# Patient Record
Sex: Female | Born: 1970
Health system: Southern US, Community
[De-identification: ages and names within clinical notes are randomized; demographics above are authoritative.]

## PROBLEM LIST (undated history)

## (undated) DIAGNOSIS — J45909 Unspecified asthma, uncomplicated: Secondary | ICD-10-CM

## (undated) DIAGNOSIS — Z9889 Other specified postprocedural states: Secondary | ICD-10-CM

## (undated) DIAGNOSIS — D649 Anemia, unspecified: Secondary | ICD-10-CM

## (undated) DIAGNOSIS — R112 Nausea with vomiting, unspecified: Secondary | ICD-10-CM

---

## 2007-05-30 ENCOUNTER — Other Ambulatory Visit: Admission: RE | Admit: 2007-05-30 | Discharge: 2007-05-30 | Payer: Self-pay | Admitting: Obstetrics and Gynecology

## 2008-03-18 ENCOUNTER — Emergency Department (HOSPITAL_COMMUNITY): Admission: EM | Admit: 2008-03-18 | Discharge: 2008-03-18 | Payer: Self-pay | Admitting: Emergency Medicine

## 2008-03-27 ENCOUNTER — Emergency Department (HOSPITAL_COMMUNITY): Admission: EM | Admit: 2008-03-27 | Discharge: 2008-03-27 | Payer: Self-pay | Admitting: Family Medicine

## 2008-05-17 ENCOUNTER — Encounter: Admission: RE | Admit: 2008-05-17 | Discharge: 2008-05-17 | Payer: Self-pay | Admitting: Obstetrics and Gynecology

## 2008-11-04 ENCOUNTER — Emergency Department (HOSPITAL_COMMUNITY): Admission: EM | Admit: 2008-11-04 | Discharge: 2008-11-04 | Payer: Self-pay | Admitting: Emergency Medicine

## 2009-07-01 ENCOUNTER — Other Ambulatory Visit: Admission: RE | Admit: 2009-07-01 | Discharge: 2009-07-01 | Payer: Self-pay | Admitting: *Deleted

## 2010-05-08 LAB — POCT PREGNANCY, URINE: Preg Test, Ur: NEGATIVE

## 2010-12-30 ENCOUNTER — Other Ambulatory Visit: Payer: Self-pay | Admitting: Obstetrics and Gynecology

## 2010-12-30 DIAGNOSIS — Z1231 Encounter for screening mammogram for malignant neoplasm of breast: Secondary | ICD-10-CM

## 2011-01-14 ENCOUNTER — Other Ambulatory Visit (HOSPITAL_COMMUNITY)
Admission: RE | Admit: 2011-01-14 | Discharge: 2011-01-14 | Disposition: A | Discharge status: Home / Self Care | Payer: Federal, State, Local not specified - PPO | Source: Ambulatory Visit | Attending: Family Medicine | Admitting: Family Medicine

## 2011-01-14 DIAGNOSIS — Z01419 Encounter for gynecological examination (general) (routine) without abnormal findings: Secondary | ICD-10-CM | POA: Insufficient documentation

## 2011-01-26 ENCOUNTER — Ambulatory Visit
Admission: RE | Admit: 2011-01-26 | Discharge: 2011-01-26 | Disposition: A | Discharge status: Home / Self Care | Payer: Federal, State, Local not specified - PPO | Source: Ambulatory Visit | Attending: Obstetrics and Gynecology | Admitting: Obstetrics and Gynecology

## 2011-01-26 DIAGNOSIS — Z1231 Encounter for screening mammogram for malignant neoplasm of breast: Secondary | ICD-10-CM

## 2012-10-12 ENCOUNTER — Other Ambulatory Visit: Payer: Self-pay

## 2012-10-12 DIAGNOSIS — Z1231 Encounter for screening mammogram for malignant neoplasm of breast: Secondary | ICD-10-CM

## 2012-10-24 ENCOUNTER — Other Ambulatory Visit (HOSPITAL_COMMUNITY)
Admission: RE | Admit: 2012-10-24 | Discharge: 2012-10-24 | Disposition: A | Discharge status: Home / Self Care | Payer: Federal, State, Local not specified - PPO | Source: Ambulatory Visit | Attending: Family Medicine | Admitting: Family Medicine

## 2012-10-24 ENCOUNTER — Other Ambulatory Visit: Payer: Self-pay

## 2012-10-24 DIAGNOSIS — Z0000 Encounter for general adult medical examination without abnormal findings: Secondary | ICD-10-CM | POA: Insufficient documentation

## 2012-11-03 ENCOUNTER — Ambulatory Visit
Admission: RE | Admit: 2012-11-03 | Discharge: 2012-11-03 | Disposition: A | Discharge status: Home / Self Care | Payer: Federal, State, Local not specified - PPO | Source: Ambulatory Visit | Attending: Family | Admitting: Family

## 2012-11-03 ENCOUNTER — Ambulatory Visit
Admission: RE | Admit: 2012-11-03 | Discharge: 2012-11-03 | Disposition: A | Discharge status: Home / Self Care | Payer: Federal, State, Local not specified - PPO | Source: Ambulatory Visit

## 2012-11-03 ENCOUNTER — Other Ambulatory Visit: Payer: Self-pay | Admitting: Family

## 2012-11-03 DIAGNOSIS — M545 Low back pain, unspecified: Secondary | ICD-10-CM

## 2012-11-03 DIAGNOSIS — Z1231 Encounter for screening mammogram for malignant neoplasm of breast: Secondary | ICD-10-CM

## 2012-11-16 ENCOUNTER — Ambulatory Visit: Payer: Federal, State, Local not specified - PPO | Attending: Family | Admitting: Physical Therapy

## 2012-11-16 DIAGNOSIS — M545 Low back pain, unspecified: Secondary | ICD-10-CM | POA: Insufficient documentation

## 2012-11-16 DIAGNOSIS — IMO0001 Reserved for inherently not codable concepts without codable children: Secondary | ICD-10-CM | POA: Insufficient documentation

## 2012-11-16 DIAGNOSIS — M25539 Pain in unspecified wrist: Secondary | ICD-10-CM | POA: Insufficient documentation

## 2012-11-21 ENCOUNTER — Ambulatory Visit: Payer: Federal, State, Local not specified - PPO | Admitting: Rehabilitation

## 2012-11-23 ENCOUNTER — Ambulatory Visit: Payer: Federal, State, Local not specified - PPO | Admitting: Rehabilitation

## 2012-11-28 ENCOUNTER — Ambulatory Visit: Payer: Federal, State, Local not specified - PPO | Attending: Family | Admitting: Physical Therapy

## 2012-11-28 DIAGNOSIS — M545 Low back pain, unspecified: Secondary | ICD-10-CM | POA: Insufficient documentation

## 2012-11-28 DIAGNOSIS — M25539 Pain in unspecified wrist: Secondary | ICD-10-CM | POA: Insufficient documentation

## 2012-11-28 DIAGNOSIS — IMO0001 Reserved for inherently not codable concepts without codable children: Secondary | ICD-10-CM | POA: Insufficient documentation

## 2012-11-30 ENCOUNTER — Ambulatory Visit: Payer: Federal, State, Local not specified - PPO | Admitting: Physical Therapy

## 2012-12-05 ENCOUNTER — Ambulatory Visit: Payer: Federal, State, Local not specified - PPO | Admitting: Physical Therapy

## 2012-12-07 ENCOUNTER — Ambulatory Visit: Payer: Federal, State, Local not specified - PPO | Admitting: Rehabilitation

## 2012-12-12 ENCOUNTER — Ambulatory Visit: Payer: Federal, State, Local not specified - PPO | Admitting: Rehabilitation

## 2012-12-14 ENCOUNTER — Ambulatory Visit: Payer: Federal, State, Local not specified - PPO | Admitting: Physical Therapy

## 2012-12-19 ENCOUNTER — Ambulatory Visit: Payer: Federal, State, Local not specified - PPO | Admitting: Rehabilitation

## 2012-12-21 ENCOUNTER — Ambulatory Visit: Payer: Federal, State, Local not specified - PPO | Admitting: Rehabilitation

## 2012-12-26 ENCOUNTER — Ambulatory Visit: Payer: Federal, State, Local not specified - PPO | Attending: Family | Admitting: Rehabilitation

## 2012-12-26 DIAGNOSIS — IMO0001 Reserved for inherently not codable concepts without codable children: Secondary | ICD-10-CM | POA: Insufficient documentation

## 2012-12-26 DIAGNOSIS — M25539 Pain in unspecified wrist: Secondary | ICD-10-CM | POA: Insufficient documentation

## 2012-12-26 DIAGNOSIS — M545 Low back pain, unspecified: Secondary | ICD-10-CM | POA: Insufficient documentation

## 2012-12-28 ENCOUNTER — Encounter: Payer: Federal, State, Local not specified - PPO | Admitting: Rehabilitation

## 2013-01-03 ENCOUNTER — Ambulatory Visit: Payer: Federal, State, Local not specified - PPO | Admitting: Rehabilitation

## 2013-01-05 ENCOUNTER — Encounter: Payer: Federal, State, Local not specified - PPO | Admitting: Physical Therapy

## 2014-08-24 ENCOUNTER — Other Ambulatory Visit: Payer: Self-pay

## 2014-08-24 DIAGNOSIS — Z1231 Encounter for screening mammogram for malignant neoplasm of breast: Secondary | ICD-10-CM

## 2014-08-28 ENCOUNTER — Ambulatory Visit
Admission: RE | Admit: 2014-08-28 | Discharge: 2014-08-28 | Disposition: A | Discharge status: Home / Self Care | Payer: Federal, State, Local not specified - PPO | Source: Ambulatory Visit

## 2014-08-28 DIAGNOSIS — Z1231 Encounter for screening mammogram for malignant neoplasm of breast: Secondary | ICD-10-CM

## 2014-09-20 ENCOUNTER — Emergency Department (HOSPITAL_COMMUNITY): Payer: Federal, State, Local not specified - PPO

## 2014-09-20 ENCOUNTER — Encounter (HOSPITAL_COMMUNITY): Payer: Self-pay | Admitting: *Deleted

## 2014-09-20 ENCOUNTER — Emergency Department (HOSPITAL_COMMUNITY)
Admission: EM | Admit: 2014-09-20 | Discharge: 2014-09-20 | Disposition: A | Discharge status: Home / Self Care | Payer: Federal, State, Local not specified - PPO | Attending: Emergency Medicine | Admitting: Emergency Medicine

## 2014-09-20 DIAGNOSIS — Z79899 Other long term (current) drug therapy: Secondary | ICD-10-CM | POA: Insufficient documentation

## 2014-09-20 DIAGNOSIS — R1013 Epigastric pain: Secondary | ICD-10-CM | POA: Insufficient documentation

## 2014-09-20 DIAGNOSIS — Z3202 Encounter for pregnancy test, result negative: Secondary | ICD-10-CM | POA: Diagnosis not present

## 2014-09-20 DIAGNOSIS — R109 Unspecified abdominal pain: Secondary | ICD-10-CM

## 2014-09-20 DIAGNOSIS — J45909 Unspecified asthma, uncomplicated: Secondary | ICD-10-CM | POA: Insufficient documentation

## 2014-09-20 DIAGNOSIS — Z7951 Long term (current) use of inhaled steroids: Secondary | ICD-10-CM | POA: Insufficient documentation

## 2014-09-20 DIAGNOSIS — R1011 Right upper quadrant pain: Secondary | ICD-10-CM | POA: Diagnosis not present

## 2014-09-20 HISTORY — DX: Unspecified asthma, uncomplicated: J45.909

## 2014-09-20 LAB — URINALYSIS, ROUTINE W REFLEX MICROSCOPIC
Bilirubin Urine: NEGATIVE
Glucose, UA: NEGATIVE mg/dL
Hgb urine dipstick: NEGATIVE
Ketones, ur: NEGATIVE mg/dL
Leukocytes, UA: NEGATIVE
Nitrite: NEGATIVE
Protein, ur: NEGATIVE mg/dL
Specific Gravity, Urine: 1.009 (ref 1.005–1.030)
Urobilinogen, UA: 0.2 mg/dL (ref 0.0–1.0)
pH: 5.5 (ref 5.0–8.0)

## 2014-09-20 LAB — CBC
HCT: 35 % — ABNORMAL LOW (ref 36.0–46.0)
Hemoglobin: 11.8 g/dL — ABNORMAL LOW (ref 12.0–15.0)
MCH: 29.4 pg (ref 26.0–34.0)
MCHC: 33.7 g/dL (ref 30.0–36.0)
MCV: 87.3 fL (ref 78.0–100.0)
Platelets: 295 10*3/uL (ref 150–400)
RBC: 4.01 MIL/uL (ref 3.87–5.11)
RDW: 13.3 % (ref 11.5–15.5)
WBC: 9.5 10*3/uL (ref 4.0–10.5)

## 2014-09-20 LAB — COMPREHENSIVE METABOLIC PANEL
ALT: 27 U/L (ref 14–54)
AST: 25 U/L (ref 15–41)
Albumin: 3.9 g/dL (ref 3.5–5.0)
Alkaline Phosphatase: 37 U/L — ABNORMAL LOW (ref 38–126)
Anion gap: 8 (ref 5–15)
BUN: 12 mg/dL (ref 6–20)
CO2: 24 mmol/L (ref 22–32)
Calcium: 9.1 mg/dL (ref 8.9–10.3)
Chloride: 105 mmol/L (ref 101–111)
Creatinine, Ser: 0.86 mg/dL (ref 0.44–1.00)
GFR calc Af Amer: >60 mL/min (ref >60)
GFR calc non Af Amer: >60 mL/min (ref >60)
Glucose, Bld: 134 mg/dL — ABNORMAL HIGH (ref 65–99)
Potassium: 3.8 mmol/L (ref 3.5–5.1)
Sodium: 137 mmol/L (ref 135–145)
Total Bilirubin: 0.5 mg/dL (ref 0.3–1.2)
Total Protein: 7 g/dL (ref 6.5–8.1)

## 2014-09-20 LAB — LIPASE, BLOOD: Lipase: 29 U/L (ref 22–51)

## 2014-09-20 LAB — POC URINE PREG, ED: Preg Test, Ur: NEGATIVE

## 2014-09-20 MED ORDER — HYDROMORPHONE HCL 1 MG/ML IJ SOLN
1.0000 mg | Freq: Once | INTRAMUSCULAR | Status: AC
Start: 2014-09-20 — End: 2014-09-20
  Administered 2014-09-20: 1 mg via INTRAVENOUS
  Filled 2014-09-20: qty 1

## 2014-09-20 MED ORDER — ONDANSETRON HCL 4 MG PO TABS: 4.0000 mg | ORAL_TABLET | Freq: Four times a day (QID) | ORAL | Status: AC

## 2014-09-20 MED ORDER — OXYCODONE-ACETAMINOPHEN 5-325 MG PO TABS: 1.0000 | ORAL_TABLET | ORAL | Status: AC | PRN

## 2014-09-20 MED ORDER — ONDANSETRON HCL 4 MG/2ML IJ SOLN
4.0000 mg | Freq: Once | INTRAMUSCULAR | Status: AC
  Administered 2014-09-20: 4 mg via INTRAVENOUS
  Filled 2014-09-20: qty 2

## 2014-09-20 MED ORDER — SODIUM CHLORIDE 0.9 % IV BOLUS (SEPSIS)
1000.0000 mL | Freq: Once | INTRAVENOUS | Status: AC
  Administered 2014-09-20: 1000 mL via INTRAVENOUS

## 2014-09-20 NOTE — ED Notes (Signed)
Patient presents stating she has pain to the RUQ that radiates to her back.  Pain was there this AM and took Aleve and it calmed down but after eating yellow rice with sausage the pain got worse.  Last took Motrin 

## 2014-09-27 NOTE — ED Provider Notes (Signed)
CSN: [id number]     Arrival date & time 09/20/14  2033 History   First MD Initiated Contact with Patient 09/20/14 2109     Chief Complaint  Patient presents with  . Abdominal Pain     (Consider location/radiation/quality/duration/timing/severity/associated sxs/prior Treatment) HPI   44 year old female with abdominal pain. Pain is in epigastrium and right upper quadrant. Radiates straight through to her back. Onset this morning. Slowly improved but not completely resolved after taking some Aleve. Acutely worsening pain within a few minutes after eating lunch. Associated nausea. No vomiting. No fevers or chills. No urinary complaints. Patient has had similar type pain intermittently previously but not nearly to this degree. Surgical history significant for cesarean section.  Past Medical History  Diagnosis Date  . Asthma    Past Surgical History  Procedure Laterality Date  . Cesarean section     No family history on file. Social History  Substance Use Topics  . Smoking status: Never Smoker   . Smokeless tobacco: Never Used  . Alcohol Use: No   OB History    No data available     Review of Systems  All systems reviewed and negative, other than as noted in HPI.   Allergies  Review of patient's allergies indicates no known allergies.  Home Medications   Prior to Admission medications   Medication Sig Start Date End Date Taking? Authorizing Provider  albuterol (PROVENTIL HFA;VENTOLIN HFA) 108 (90 BASE) MCG/ACT inhaler Inhale 1-2 puffs into the lungs every 6 (six) hours as needed for wheezing or shortness of breath.   Yes Historical Provider, MD  budesonide (PULMICORT) 180 MCG/ACT inhaler Inhale 1 puff into the lungs 2 (two) times daily.   Yes Historical Provider, MD  cetirizine (ZYRTEC) 10 MG tablet Take 10 mg by mouth daily.   Yes Historical Provider, MD  ibuprofen (ADVIL,MOTRIN) 200 MG tablet Take 400 mg by mouth every 6 (six) hours as needed for mild pain or moderate  pain.   Yes Historical Provider, MD  ibuprofen (ADVIL,MOTRIN) 800 MG tablet Take 800 mg by mouth every 8 (eight) hours as needed for mild pain, moderate pain or cramping.   Yes Historical Provider, MD  Multiple Vitamins-Iron (MULTIVITAMIN/IRON PO) Take 1 tablet by mouth daily at 12 noon.   Yes Historical Provider, MD  ondansetron (ZOFRAN) 4 MG tablet Take 1 tablet (4 mg total) by mouth every 6 (six) hours. 09/20/14   Raeford Razor, MD  oxyCODONE-acetaminophen (PERCOCET/ROXICET) 5-325 MG per tablet Take 1-2 tablets by mouth every 4 (four) hours as needed for severe pain. 09/20/14   Raeford Razor, MD   BP 122/87 mmHg  Pulse 74  Temp(Src) 98.7 F (37.1 C) (Oral)  Resp 20  Ht 5\' 4"  (1.626 m)  Wt 215 lb 11.2 oz (97.841 kg)  BMI 37.01 kg/m2  SpO2 100%  LMP 08/31/2014 Physical Exam  Constitutional: She appears well-developed and well-nourished. No distress.  HENT:  Head: Normocephalic and atraumatic.  Eyes: Conjunctivae are normal. Right eye exhibits no discharge. Left eye exhibits no discharge.  Neck: Neck supple.  Cardiovascular: Normal rate, regular rhythm and normal heart sounds.  Exam reveals no gallop and no friction rub.   No murmur heard. Pulmonary/Chest: Effort normal and breath sounds normal. No respiratory distress.  Abdominal: Soft. She exhibits no distension. There is tenderness.  Tenderness in the right upper quadrant with guarding on deep palpation. No rebound. Does not seem distended. Abdominal exam is somewhat limited by body habitus though.  Musculoskeletal: She exhibits no  edema or tenderness.  Neurological: She is alert.  Skin: Skin is warm and dry.  Psychiatric: She has a normal mood and affect. Her behavior is normal. Thought content normal.  Nursing note and vitals reviewed.   ED Course  Procedures (including critical care time) Labs Review Labs Reviewed  COMPREHENSIVE METABOLIC PANEL - Abnormal; Notable for the following:    Glucose, Bld 134 (*)    Alkaline  Phosphatase 37 (*)    All other components within normal limits  CBC - Abnormal; Notable for the following:    Hemoglobin 11.8 (*)    HCT 35.0 (*)    All other components within normal limits  LIPASE, BLOOD  URINALYSIS, ROUTINE W REFLEX MICROSCOPIC (NOT AT [facility])  POC URINE PREG, ED    Imaging Review No results found. I have personally reviewed and evaluated these images and lab results as part of my medical decision-making.   EKG Interpretation None      MDM   Final diagnoses:  Abdominal pain, unspecified abdominal location    44 year old female with symptoms consistent with biliary colic. Gallstones noted on ultrasound. No ultrasound evidence of cholecystitis. Symptoms now improved. Plan continued symptomatic treatment. General surgical follow-up.    Raeford Razor, MD 09/27/14 1044

## 2014-10-05 ENCOUNTER — Other Ambulatory Visit: Payer: Self-pay | Admitting: General Surgery

## 2014-10-09 NOTE — Pre-Procedure Instructions (Signed)
Dawn Arroyo  10/09/2014      [id number] - [place], [place] - [address] AT [facility] [address] [place] [address] Phone: [phone] Fax: [phone]    Your procedure is scheduled on Thurs, Sept 15 @ 7:30 AM  Report to [facility] Admitting at 5:30 AM  Call this number if you have problems the morning of surgery:  [phone]   Remember:  Do not eat food or drink liquids after midnight.  Take these medicines the morning of surgery with A SIP OF WATER Albuterol<Bring Your Inhaler With You>,Pulmicort,Zyrtec(Cetirizine),Ondansetron(Zofran-if needed),and Pain Pill(if needed)              Stop taking your Ibuprofen. No Goody's,BC's,Aleve,Aspirin,Fish Oil,or any Herbal Medications.    Do not wear jewelry, make-up or nail polish.  Do not wear lotions, powders, or perfumes.  You may wear deodorant.  Do not shave 48 hours prior to surgery.    Do not bring valuables to the hospital.  [facility] is not responsible for any belongings or valuables.  Contacts, dentures or bridgework may not be worn into surgery.  Leave your suitcase in the car.  After surgery it may be brought to your room.  For patients admitted to the hospital, discharge time will be determined by your treatment team.  Patients discharged the day of surgery will not be allowed to drive home.    Special instructions:  [facility] - Preparing for Surgery  Before surgery, you can play an important role.  Because skin is not sterile, your skin needs to be as free of germs as possible.  You can reduce the number of germs on you skin by washing with CHG (chlorahexidine gluconate) soap before surgery.  CHG is an antiseptic cleaner which kills germs and bonds with the skin to continue killing germs even after washing.  Please DO NOT use if you have an allergy to CHG or antibacterial soaps.  If your skin becomes reddened/irritated stop using the CHG and  inform your nurse when you arrive at Short Stay.  Do not shave (including legs and underarms) for at least 48 hours prior to the first CHG shower.  You may shave your face.  Please follow these instructions carefully:   1.  Shower with CHG Soap the night before surgery and the                                morning of Surgery.  2.  If you choose to wash your hair, wash your hair first as usual with your       normal shampoo.  3.  After you shampoo, rinse your hair and body thoroughly to remove the                      Shampoo.  4.  Use CHG as you would any other liquid soap.  You can apply chg directly       to the skin and wash gently with scrungie or a clean washcloth.  5.  Apply the CHG Soap to your body ONLY FROM THE NECK DOWN.        Do not use on open wounds or open sores.  Avoid contact with your eyes,       ears, mouth and genitals (private parts).  Wash genitals (private parts)  with your normal soap.  6.  Wash thoroughly, paying special attention to the area where your surgery        will be performed.  7.  Thoroughly rinse your body with warm water from the neck down.  8.  DO NOT shower/wash with your normal soap after using and rinsing off       the CHG Soap.  9.  Pat yourself dry with a clean towel.            10.  Wear clean pajamas.            11.  Place clean sheets on your bed the night of your first shower and do not        sleep with pets.  Day of Surgery  Do not apply any lotions/deoderants the morning of surgery.  Please wear clean clothes to the hospital/surgery center.    Please read over the following fact sheets that you were given. Pain Booklet, Coughing and Deep Breathing and Surgical Site Infection Prevention

## 2014-10-10 ENCOUNTER — Encounter (HOSPITAL_COMMUNITY): Payer: Self-pay | Admitting: *Deleted

## 2014-10-10 ENCOUNTER — Inpatient Hospital Stay (HOSPITAL_COMMUNITY)
Admission: RE | Admit: 2014-10-10 | Discharge: 2014-10-10 | Disposition: A | Discharge status: Home / Self Care | Payer: Federal, State, Local not specified - PPO | Source: Ambulatory Visit

## 2014-10-10 NOTE — Progress Notes (Signed)
Pt denies any cardiac history, chest pain or sob. 

## 2014-10-11 ENCOUNTER — Encounter (HOSPITAL_COMMUNITY)
Admission: RE | Disposition: A | Discharge status: Home / Self Care | Payer: Self-pay | Source: Ambulatory Visit | Attending: General Surgery

## 2014-10-11 ENCOUNTER — Ambulatory Visit (HOSPITAL_COMMUNITY)
Admission: RE | Admit: 2014-10-11 | Discharge: 2014-10-11 | Disposition: A | Discharge status: Home / Self Care | Payer: Federal, State, Local not specified - PPO | Source: Ambulatory Visit | Attending: General Surgery | Admitting: General Surgery

## 2014-10-11 ENCOUNTER — Ambulatory Visit (HOSPITAL_COMMUNITY): Payer: Federal, State, Local not specified - PPO | Admitting: Anesthesiology

## 2014-10-11 ENCOUNTER — Encounter (HOSPITAL_COMMUNITY): Payer: Self-pay | Admitting: *Deleted

## 2014-10-11 DIAGNOSIS — K8010 Calculus of gallbladder with chronic cholecystitis without obstruction: Secondary | ICD-10-CM | POA: Diagnosis present

## 2014-10-11 HISTORY — PX: CHOLECYSTECTOMY: SHX55

## 2014-10-11 HISTORY — DX: Other specified postprocedural states: Z98.890

## 2014-10-11 HISTORY — DX: Anemia, unspecified: D64.9

## 2014-10-11 HISTORY — DX: Other specified postprocedural states: R11.2

## 2014-10-11 LAB — CBC WITH DIFFERENTIAL/PLATELET
Basophils Absolute: 0 10*3/uL (ref 0.0–0.1)
Basophils Relative: 1 %
Eosinophils Absolute: 0.3 10*3/uL (ref 0.0–0.7)
Eosinophils Relative: 5 %
HCT: 33.1 % — ABNORMAL LOW (ref 36.0–46.0)
Hemoglobin: 10.8 g/dL — ABNORMAL LOW (ref 12.0–15.0)
Lymphocytes Relative: 23 %
Lymphs Abs: 1.4 10*3/uL (ref 0.7–4.0)
MCH: 29 pg (ref 26.0–34.0)
MCHC: 32.6 g/dL (ref 30.0–36.0)
MCV: 89 fL (ref 78.0–100.0)
Monocytes Absolute: 0.5 10*3/uL (ref 0.1–1.0)
Monocytes Relative: 8 %
Neutro Abs: 3.8 10*3/uL (ref 1.7–7.7)
Neutrophils Relative %: 63 %
Platelets: 268 10*3/uL (ref 150–400)
RBC: 3.72 MIL/uL — ABNORMAL LOW (ref 3.87–5.11)
RDW: 13.2 % (ref 11.5–15.5)
WBC: 6 10*3/uL (ref 4.0–10.5)

## 2014-10-11 LAB — COMPREHENSIVE METABOLIC PANEL
ALT: 17 U/L (ref 14–54)
AST: 16 U/L (ref 15–41)
Albumin: 3.7 g/dL (ref 3.5–5.0)
Alkaline Phosphatase: 33 U/L — ABNORMAL LOW (ref 38–126)
Anion gap: 8 (ref 5–15)
BUN: 11 mg/dL (ref 6–20)
CO2: 24 mmol/L (ref 22–32)
Calcium: 8.6 mg/dL — ABNORMAL LOW (ref 8.9–10.3)
Chloride: 104 mmol/L (ref 101–111)
Creatinine, Ser: 0.87 mg/dL (ref 0.44–1.00)
GFR calc Af Amer: >60 mL/min (ref >60)
GFR calc non Af Amer: >60 mL/min (ref >60)
Glucose, Bld: 94 mg/dL (ref 65–99)
Potassium: 3.7 mmol/L (ref 3.5–5.1)
Sodium: 136 mmol/L (ref 135–145)
Total Bilirubin: 0.8 mg/dL (ref 0.3–1.2)
Total Protein: 6.4 g/dL — ABNORMAL LOW (ref 6.5–8.1)

## 2014-10-11 LAB — HCG, SERUM, QUALITATIVE: Preg, Serum: NEGATIVE

## 2014-10-11 SURGERY — LAPAROSCOPIC CHOLECYSTECTOMY
Anesthesia: General

## 2014-10-11 MED ORDER — PROPOFOL 10 MG/ML IV BOLUS
INTRAVENOUS | Status: AC
  Filled 2014-10-11: qty 20

## 2014-10-11 MED ORDER — PROMETHAZINE HCL 25 MG/ML IJ SOLN
6.2500 mg | INTRAMUSCULAR | Status: DC | PRN
  Administered 2014-10-11: 6.25 mg via INTRAVENOUS

## 2014-10-11 MED ORDER — ROCURONIUM BROMIDE 100 MG/10ML IV SOLN
INTRAVENOUS | Status: DC | PRN
  Administered 2014-10-11 (×2): 10 mg via INTRAVENOUS
  Administered 2014-10-11: 30 mg via INTRAVENOUS

## 2014-10-11 MED ORDER — GLYCOPYRROLATE 0.2 MG/ML IJ SOLN
INTRAMUSCULAR | Status: DC | PRN
  Administered 2014-10-11: 0.6 mg via INTRAVENOUS

## 2014-10-11 MED ORDER — DEXAMETHASONE SODIUM PHOSPHATE 10 MG/ML IJ SOLN
INTRAMUSCULAR | Status: AC
  Filled 2014-10-11: qty 1

## 2014-10-11 MED ORDER — FENTANYL CITRATE (PF) 100 MCG/2ML IJ SOLN
INTRAMUSCULAR | Status: DC | PRN
  Administered 2014-10-11 (×2): 100 ug via INTRAVENOUS
  Administered 2014-10-11 (×6): 50 ug via INTRAVENOUS

## 2014-10-11 MED ORDER — GLYCOPYRROLATE 0.2 MG/ML IJ SOLN
INTRAMUSCULAR | Status: AC
  Filled 2014-10-11: qty 3

## 2014-10-11 MED ORDER — 0.9 % SODIUM CHLORIDE (POUR BTL) OPTIME
TOPICAL | Status: DC | PRN
  Administered 2014-10-11: 1000 mL

## 2014-10-11 MED ORDER — ROCURONIUM BROMIDE 50 MG/5ML IV SOLN
INTRAVENOUS | Status: AC
  Filled 2014-10-11: qty 1

## 2014-10-11 MED ORDER — PROPOFOL 10 MG/ML IV BOLUS
INTRAVENOUS | Status: DC | PRN
  Administered 2014-10-11: 200 mg via INTRAVENOUS

## 2014-10-11 MED ORDER — ONDANSETRON HCL 4 MG/2ML IJ SOLN
INTRAMUSCULAR | Status: AC
  Filled 2014-10-11: qty 2

## 2014-10-11 MED ORDER — SCOPOLAMINE 1 MG/3DAYS TD PT72
MEDICATED_PATCH | TRANSDERMAL | Status: DC | PRN
  Administered 2014-10-11: 1 via TRANSDERMAL

## 2014-10-11 MED ORDER — PROMETHAZINE HCL 25 MG/ML IJ SOLN
INTRAMUSCULAR | Status: AC
  Administered 2014-10-11: 6.25 mg via INTRAVENOUS
  Filled 2014-10-11: qty 1

## 2014-10-11 MED ORDER — BUPIVACAINE-EPINEPHRINE 0.25% -1:200000 IJ SOLN
INTRAMUSCULAR | Status: DC | PRN
Start: 2014-10-11 — End: 2014-10-11
  Administered 2014-10-11: 10 mL

## 2014-10-11 MED ORDER — SCOPOLAMINE 1 MG/3DAYS TD PT72
MEDICATED_PATCH | TRANSDERMAL | Status: AC
  Filled 2014-10-11: qty 1

## 2014-10-11 MED ORDER — ARTIFICIAL TEARS OP OINT
TOPICAL_OINTMENT | OPHTHALMIC | Status: DC | PRN
Start: 2014-10-11 — End: 2014-10-11
  Administered 2014-10-11: 1 via OPHTHALMIC

## 2014-10-11 MED ORDER — OXYCODONE-ACETAMINOPHEN 10-325 MG PO TABS: 1.0000 | ORAL_TABLET | Freq: Four times a day (QID) | ORAL | Status: AC | PRN

## 2014-10-11 MED ORDER — LIDOCAINE HCL (CARDIAC) 20 MG/ML IV SOLN
INTRAVENOUS | Status: DC | PRN
  Administered 2014-10-11: 80 mg via INTRAVENOUS

## 2014-10-11 MED ORDER — FENTANYL CITRATE (PF) 250 MCG/5ML IJ SOLN
INTRAMUSCULAR | Status: AC
  Filled 2014-10-11: qty 5

## 2014-10-11 MED ORDER — CEFAZOLIN SODIUM-DEXTROSE 2-3 GM-% IV SOLR: 2.0000 g | INTRAVENOUS | Status: DC

## 2014-10-11 MED ORDER — HYDROMORPHONE HCL 1 MG/ML IJ SOLN
0.5000 mg | INTRAMUSCULAR | Status: DC | PRN
  Administered 2014-10-11: 0.25 mg via INTRAVENOUS

## 2014-10-11 MED ORDER — OXYCODONE HCL 5 MG PO TABS
ORAL_TABLET | ORAL | Status: AC
  Administered 2014-10-11: 10 mg via ORAL
  Filled 2014-10-11: qty 2

## 2014-10-11 MED ORDER — NEOSTIGMINE METHYLSULFATE 10 MG/10ML IV SOLN
INTRAVENOUS | Status: AC
  Filled 2014-10-11: qty 1

## 2014-10-11 MED ORDER — KETOROLAC TROMETHAMINE 15 MG/ML IJ SOLN
INTRAMUSCULAR | Status: AC
  Administered 2014-10-11: 15 mg via INTRAVENOUS
  Filled 2014-10-11: qty 1

## 2014-10-11 MED ORDER — ONDANSETRON HCL 4 MG/2ML IJ SOLN
INTRAMUSCULAR | Status: DC | PRN
  Administered 2014-10-11: 4 mg via INTRAVENOUS

## 2014-10-11 MED ORDER — SODIUM CHLORIDE 0.9 % IR SOLN
Status: DC | PRN
  Administered 2014-10-11: 1000 mL

## 2014-10-11 MED ORDER — NEOSTIGMINE METHYLSULFATE 10 MG/10ML IV SOLN
INTRAVENOUS | Status: DC | PRN
  Administered 2014-10-11: 4 mg via INTRAVENOUS

## 2014-10-11 MED ORDER — GLYCOPYRROLATE 0.2 MG/ML IJ SOLN
INTRAMUSCULAR | Status: AC
  Filled 2014-10-11: qty 4

## 2014-10-11 MED ORDER — OXYCODONE HCL 5 MG PO TABS
5.0000 mg | ORAL_TABLET | ORAL | Status: DC | PRN
  Administered 2014-10-11: 10 mg via ORAL
  Filled 2014-10-11: qty 2

## 2014-10-11 MED ORDER — CEFAZOLIN SODIUM-DEXTROSE 2-3 GM-% IV SOLR
INTRAVENOUS | Status: AC
  Administered 2014-10-11: 2 g via INTRAVENOUS
  Filled 2014-10-11: qty 50

## 2014-10-11 MED ORDER — HYDROMORPHONE HCL 1 MG/ML IJ SOLN
INTRAMUSCULAR | Status: AC
  Administered 2014-10-11: 0.25 mg via INTRAVENOUS
  Filled 2014-10-11: qty 1

## 2014-10-11 MED ORDER — BUPIVACAINE-EPINEPHRINE (PF) 0.25% -1:200000 IJ SOLN
INTRAMUSCULAR | Status: AC
  Filled 2014-10-11: qty 30

## 2014-10-11 MED ORDER — KETOROLAC TROMETHAMINE 15 MG/ML IJ SOLN
15.0000 mg | Freq: Four times a day (QID) | INTRAMUSCULAR | Status: DC | PRN
  Administered 2014-10-11: 15 mg via INTRAVENOUS
  Filled 2014-10-11 (×2): qty 1

## 2014-10-11 MED ORDER — LACTATED RINGERS IV SOLN
INTRAVENOUS | Status: DC | PRN
  Administered 2014-10-11 (×2): via INTRAVENOUS

## 2014-10-11 MED ORDER — DEXAMETHASONE SODIUM PHOSPHATE 10 MG/ML IJ SOLN
INTRAMUSCULAR | Status: DC | PRN
  Administered 2014-10-11: 10 mg via INTRAVENOUS

## 2014-10-11 MED ORDER — LIDOCAINE HCL (CARDIAC) 20 MG/ML IV SOLN
INTRAVENOUS | Status: AC
  Filled 2014-10-11: qty 5

## 2014-10-11 MED ORDER — MIDAZOLAM HCL 2 MG/2ML IJ SOLN
INTRAMUSCULAR | Status: AC
  Filled 2014-10-11: qty 4

## 2014-10-11 MED ORDER — MIDAZOLAM HCL 5 MG/5ML IJ SOLN
INTRAMUSCULAR | Status: DC | PRN
  Administered 2014-10-11: 2 mg via INTRAVENOUS

## 2014-10-11 SURGICAL SUPPLY — 38 items
APPLIER CLIP 5 13 M/L LIGAMAX5 (MISCELLANEOUS) ×2
BLADE SURG ROTATE 9660 (MISCELLANEOUS) ×2 IMPLANT
CANISTER SUCTION 2500CC (MISCELLANEOUS) ×2 IMPLANT
CHLORAPREP W/TINT 26ML (MISCELLANEOUS) ×4 IMPLANT
CLIP APPLIE 5 13 M/L LIGAMAX5 (MISCELLANEOUS) ×1 IMPLANT
CLSR STERI-STRIP ANTIMIC 1/2X4 (GAUZE/BANDAGES/DRESSINGS) ×2 IMPLANT
COVER SURGICAL LIGHT HANDLE (MISCELLANEOUS) ×2 IMPLANT
DEVICE TROCAR PUNCTURE CLOSURE (ENDOMECHANICALS) ×2 IMPLANT
ELECT REM PT RETURN 9FT ADLT (ELECTROSURGICAL) ×2
ELECTRODE REM PT RTRN 9FT ADLT (ELECTROSURGICAL) ×1 IMPLANT
GLOVE BIO SURGEON STRL SZ7 (GLOVE) ×2 IMPLANT
GLOVE BIO SURGEON STRL SZ8 (GLOVE) ×2 IMPLANT
GLOVE BIOGEL PI IND STRL 7.5 (GLOVE) ×1 IMPLANT
GLOVE BIOGEL PI INDICATOR 7.5 (GLOVE) ×1
GOWN STRL REUS W/ TWL LRG LVL3 (GOWN DISPOSABLE) ×2 IMPLANT
GOWN STRL REUS W/ TWL XL LVL3 (GOWN DISPOSABLE) ×1 IMPLANT
GOWN STRL REUS W/TWL LRG LVL3 (GOWN DISPOSABLE) ×2
GOWN STRL REUS W/TWL XL LVL3 (GOWN DISPOSABLE) ×1
KIT BASIN OR (CUSTOM PROCEDURE TRAY) ×2 IMPLANT
KIT ROOM TURNOVER OR (KITS) ×2 IMPLANT
LIQUID BAND (GAUZE/BANDAGES/DRESSINGS) ×2 IMPLANT
NS IRRIG 1000ML POUR BTL (IV SOLUTION) ×2 IMPLANT
PAD ARMBOARD 7.5X6 YLW CONV (MISCELLANEOUS) ×4 IMPLANT
POUCH RETRIEVAL ECOSAC 10 (ENDOMECHANICALS) ×1 IMPLANT
POUCH RETRIEVAL ECOSAC 10MM (ENDOMECHANICALS) ×1
SCISSORS LAP 5X35 DISP (ENDOMECHANICALS) ×2 IMPLANT
SET IRRIG TUBING LAPAROSCOPIC (IRRIGATION / IRRIGATOR) ×2 IMPLANT
SLEEVE ENDOPATH XCEL 5M (ENDOMECHANICALS) ×4 IMPLANT
SPECIMEN JAR SMALL (MISCELLANEOUS) ×2 IMPLANT
STRIP CLOSURE SKIN 1/2X4 (GAUZE/BANDAGES/DRESSINGS) ×2 IMPLANT
SUT MNCRL AB 4-0 PS2 18 (SUTURE) ×2 IMPLANT
SUT VICRYL 0 UR6 27IN ABS (SUTURE) ×4 IMPLANT
TOWEL OR 17X24 6PK STRL BLUE (TOWEL DISPOSABLE) ×2 IMPLANT
TOWEL OR 17X26 10 PK STRL BLUE (TOWEL DISPOSABLE) ×2 IMPLANT
TRAY LAPAROSCOPIC MC (CUSTOM PROCEDURE TRAY) ×2 IMPLANT
TROCAR XCEL BLUNT TIP 100MML (ENDOMECHANICALS) ×2 IMPLANT
TROCAR XCEL NON-BLD 5MMX100MML (ENDOMECHANICALS) ×2 IMPLANT
TUBING INSUFFLATION (TUBING) ×2 IMPLANT

## 2014-10-11 NOTE — H&P (Signed)
44 yof with history of ruq pain after eating worse a couple weeks ago requiring visit to er. associated with some nausea. underwent US that showed multiple stones largest 4 cm. lfts and lipase were normal. since then she continues to have daily pain worse with eating. no fevers.  Other Problems Gilmer Mor, CMA; 10/05/2014 9:52 AM) Asthma Back Pain Cholelithiasis Heart murmur Hemorrhoids  Past Surgical History Gilmer Mor, CMA; 10/05/2014 9:52 AM) Cesarean Section - Multiple  Diagnostic Studies History Gilmer Mor, CMA; 10/05/2014 9:52 AM) Colonoscopy never Mammogram within last year Pap Smear 1-5 years ago  Allergies Gilmer Mor, CMA; 10/05/2014 9:53 AM) No Known Drug Allergies09/09/2014  Medication History (Sonya Bynum, CMA; 10/05/2014 9:53 AM) Advil (  Tablet Chewable, Oral as needed) Active. Albuterol (90MCG/ACT Aerosol Soln, Inhalation as needed) Active. ZyrTEC Allergy (  Capsule, Oral as needed) Active. Medications Reconciled  Social History Gilmer Mor, CMA; 10/05/2014 9:52 AM) Alcohol use Occasional alcohol use. Caffeine use Coffee. No drug use Tobacco use Never smoker.  Family History Gilmer Mor, CMA; 10/05/2014 9:52 AM) Alcohol Abuse Father. Arthritis Mother. Colon Polyps Father.  Pregnancy / Birth History Gilmer Mor, CMA; 10/05/2014 9:52 AM) Age at menarche 12 years. Gravida 3 Maternal age 5-25 Para 2 Regular periods  Review of Systems Lamar Laundry Bynum CMA; 10/05/2014 9:52 AM) General Not Present- Appetite Loss, Chills, Fatigue, Fever, Night Sweats, Weight Gain and Weight Loss. Skin Not Present- Change in Wart/Mole, Dryness, Hives, Jaundice, New Lesions, Non-Healing Wounds, Rash and Ulcer. HEENT Present- Seasonal Allergies. Not Present- Earache, Hearing Loss, Hoarseness, Nose Bleed, Oral Ulcers, Ringing in the Ears, Sinus Pain, Sore Throat, Visual Disturbances, Wears glasses/contact lenses and Yellow Eyes. Breast Not Present- Breast  Mass, Breast Pain, Nipple Discharge and Skin Changes. Cardiovascular Not Present- Chest Pain, Difficulty Breathing Lying Down, Leg Cramps, Palpitations, Rapid Heart Rate, Shortness of Breath and Swelling of Extremities. Gastrointestinal Present- Abdominal Pain, Bloating and Change in Bowel Habits. Not Present- Bloody Stool, Chronic diarrhea, Constipation, Difficulty Swallowing, Excessive gas, Gets full quickly at meals, Hemorrhoids, Indigestion, Nausea, Rectal Pain and Vomiting. Female Genitourinary Not Present- Frequency, Nocturia, Painful Urination, Pelvic Pain and Urgency. Musculoskeletal Not Present- Back Pain, Joint Pain, Joint Stiffness, Muscle Pain, Muscle Weakness and Swelling of Extremities. Neurological Not Present- Decreased Memory, Fainting, Headaches, Numbness, Seizures, Tingling, Tremor, Trouble walking and Weakness. Psychiatric Not Present- Anxiety, Bipolar, Change in Sleep Pattern, Depression, Fearful and Frequent crying. Endocrine Not Present- Cold Intolerance, Excessive Hunger, Hair Changes, Heat Intolerance, Hot flashes and New Diabetes. Hematology Not Present- Easy Bruising, Excessive bleeding, Gland problems, HIV and Persistent Infections.   Vitals (Sonya Bynum CMA; 10/05/2014 9:53 AM) 10/05/2014 9:52 AM Weight: 212 lb Height: 64in Body Surface Area: 2.08 m Body Mass Index: 36.39 kg/m Temp.: 41F(Temporal)  Pulse: 79 (Regular)  BP: 126/80 (Sitting, Left Arm, Standard) Physical Exam Emelia Loron MD; 10/05/2014 10:05 AM) General Mental Status-Alert. Orientation-Oriented X3. Eye Sclera/Conjunctiva - Bilateral-No scleral icterus. Chest and Lung Exam Chest and lung exam reveals -on auscultation, normal breath sounds, no adventitious sounds and normal vocal resonance. Cardiovascular Cardiovascular examination reveals -normal heart sounds, regular rate and rhythm with no murmurs. Abdomen Note: soft tender epigastrium and ruq   Assessment & Plan  Emelia Loron MD; 10/05/2014 10:04 AM) GALLSTONES (K80.20) Story: plan for lap chole I discussed the procedure in detail. The patient was given Agricultural engineer. We discussed the risks and benefits of a laparoscopic cholecystectomy and possible cholangiogram including, but not limited to bleeding, infection, injury to surrounding structures such as the intestine or  liver, bile leak, retained gallstones, need to convert to an open procedure, prolonged diarrhea, blood clots such as DVT, common bile duct injury, anesthesia risks, and possible need for additional procedures. The likelihood of improvement in symptoms and return to the patient's normal status is good. We discussed the typical post-operative recovery course.

## 2014-10-11 NOTE — Anesthesia Preprocedure Evaluation (Signed)
Anesthesia Evaluation  Patient identified by MRN, date of birth, ID band Patient awake    Reviewed: Allergy & Precautions, NPO status , Patient's Chart, lab work & pertinent test results  History of Anesthesia Complications (+) PONV  Airway Mallampati: II  TM Distance: >3 FB     Dental   Pulmonary asthma ,    breath sounds clear to auscultation       Cardiovascular negative cardio ROS   Rhythm:Regular Rate:Normal     Neuro/Psych    GI/Hepatic negative GI ROS, Neg liver ROS,   Endo/Other  negative endocrine ROS  Renal/GU negative Renal ROS     Musculoskeletal   Abdominal   Peds  Hematology   Anesthesia Other Findings   Reproductive/Obstetrics                             Anesthesia Physical Anesthesia Plan  ASA: II  Anesthesia Plan: General   Post-op Pain Management:    Induction: Intravenous  Airway Management Planned:   Additional Equipment:   Intra-op Plan:   Post-operative Plan:   Informed Consent: I have reviewed the patients History and Physical, chart, labs and discussed the procedure including the risks, benefits and alternatives for the proposed anesthesia with the patient or authorized representative who has indicated his/her understanding and acceptance.   Dental advisory given  Plan Discussed with: CRNA and Anesthesiologist  Anesthesia Plan Comments:         Anesthesia Quick Evaluation

## 2014-10-11 NOTE — Interval H&P Note (Signed)
History and Physical Interval Note:  10/11/2014 7:09 AM  Dawn Arroyo  has presented today for surgery, with the diagnosis of gallstones  The various methods of treatment have been discussed with the patient and family. After consideration of risks, benefits and other options for treatment, the patient has consented to  Procedure(s): LAPAROSCOPIC CHOLECYSTECTOMY (N/A) as a surgical intervention .  The patient's history has been reviewed, patient examined, no change in status, stable for surgery.  I have reviewed the patient's chart and labs.  Questions were answered to the patient's satisfaction.     [name]

## 2014-10-11 NOTE — Anesthesia Postprocedure Evaluation (Signed)
  Anesthesia Post-op Note  Patient: Dawn Arroyo  Procedure(s) Performed: Procedure(s): LAPAROSCOPIC CHOLECYSTECTOMY (N/A)  Patient Location: PACU  Anesthesia Type:General  Level of Consciousness: awake  Airway and Oxygen Therapy: Patient Spontanous Breathing  Post-op Pain: mild  Post-op Assessment: Post-op Vital signs reviewed              Post-op Vital Signs: Reviewed  Last Vitals:  Filed Vitals:   10/11/14 1149  BP: 122/75  Pulse: 66  Temp: 36.9 C  Resp: 20    Complications: No apparent anesthesia complications

## 2014-10-11 NOTE — Op Note (Signed)
Preoperative diagnosis: gallstones Postoperative diagnosis: cholecystitis Procedure: laparoscopic cholecystectomy  Surgeon: Dr Harden Mo Anesthesia: general EBL: minimal Drains none Specimen gb and contents to pathology Complications: none Sponge count correct at completion Disposition to recovery stable  Indications: This is an 43 yof with gallstones and symptoms related to them.  We discussed proceeding with lap chole.   Procedure: After informed consent was obtained the patient was taken to the operating room. She was already given antibiotics. Sequential compression devices were on her legs. She was placed under general anesthesia without complication. Her abdomen was prepped and draped in the standard sterile surgical fashion. A surgical timeout was then performed.  I infiltrated marcaine below the umbilicus. I made an incision and then entered the fascia sharply. I then entered the peritoneum bluntly. There was no evidence of an entry injury. I placed a 0 vicryl pursestring suture and inserted a hasson trocar. I then inserted 3 further 5 mm trocars in the epigastrium and ruq. The duodenum was adherent and this was taken down bluntly. She did appear to have cholecystitis.  I did make a small rent in gallbladder with drainage of white fluid.  I  was able to retract the gallbladder cephalad and lateral.  Eventually I was able to identify the cystic duct and clearly had the critical view of safety. I then clipped the cystic duct and divided it. The duct was viable and the clips traversed the duct.  I then clipped the cystic artery and divided it. I then removed the gallbladder from the liver bed and placed it in a bag. It was then removed from the umbilical incision. I then obtained hemostasis and irrigated. there was a piece of omentum adherent to the abdominal wall in the infraumbilical position that I took down bluntly.  I then removed the umbilical trocar and closed with 0  vicryl and the endoclose device after tying down the pursestring.  I then desufflated the abdomen and removed all my remaining trocars. I then closed these with 4-0 Monocryl and Dermabond. She tolerated this well was extubated and transferred to the recovery room in stable condition

## 2014-10-11 NOTE — Discharge Instructions (Signed)
[facility] LAPAROSCOPIC SURGERY: POST OP INSTRUCTIONS  Always review your discharge instruction sheet given to you by the facility where your surgery was performed. IF YOU HAVE DISABILITY OR FAMILY LEAVE FORMS, YOU MUST BRING THEM TO THE OFFICE FOR PROCESSING.   DO NOT GIVE THEM TO YOUR DOCTOR.  1. A prescription for pain medication may be given to you upon discharge.  Take your pain medication as prescribed, if needed.  If narcotic pain medicine is not needed, then you may take acetaminophen (Tylenol), naprosyn (Alleve), or ibuprofen (Advil) as needed. 2. Take your usually prescribed medications unless otherwise directed. 3. If you need a refill on your pain medication, please contact your pharmacy.  They will contact our office to request authorization. Prescriptions will not be filled after 5pm or on week-ends. 4. You should follow a light diet the first few days after arrival home, such as soup and crackers, etc.  Be sure to include lots of fluids daily. 5. Most patients will experience some swelling and bruising in the area of the incisions.  Ice packs will help.  Swelling and bruising can take several days to resolve.  6. It is common to experience some constipation if taking pain medication after surgery.  Increasing fluid intake and taking a stool softener (such as Colace) will usually help or prevent this problem from occurring.  A mild laxative (Milk of Magnesia or Miralax) should be taken according to package instructions if there are no bowel movements after 48 hours. 7. Unless discharge instructions indicate otherwise, you may remove your bandages 48 hours after surgery, and you may shower at that time.  You may have steri-strips (small skin tapes) in place directly over the incision.  These strips should be left on the skin for 7-10 days.  If your surgeon used skin glue on the incision, you may shower in 24 hours.  The glue will flake  off over the next 2-3 weeks.  Any sutures or staples will be removed at the office during your follow-up visit. 8. ACTIVITIES:  You may resume regular (light) daily activities beginning the next day--such as daily self-care, walking, climbing stairs--gradually increasing activities as tolerated.  You may have sexual intercourse when it is comfortable.  Refrain from any heavy lifting or straining until approved by your doctor. a. You may drive when you are no longer taking prescription pain medication, you can comfortably wear a seatbelt, and you can safely maneuver your car and apply brakes. b. RETURN TO WORK:  __________________________________________________________ 9. You should see your doctor in the office for a follow-up appointment approximately 2-3 weeks after your surgery.  Make sure that you call for this appointment within a day or two after you arrive home to insure a convenient appointment time. 10. OTHER INSTRUCTIONS: __________________________________________________________________________________________________________________________ __________________________________________________________________________________________________________________________ WHEN TO CALL YOUR DOCTOR: 1. Fever over 101.0 2. Inability to urinate 3. Continued bleeding from incision. 4. Increased pain, redness, or drainage from the incision. 5. Increasing abdominal pain  The clinic staff is available to answer your questions during regular business hours.  Please don't hesitate to call and ask to speak to one of the nurses for clinical concerns.  If you have a medical emergency, go to the nearest emergency room or call 911.  A surgeon from [facility] is always on call at the hospital. [address], [address], [place], [place]  [address] ? [address] [address], [place], [place]   [address] [phone] ? [phone] ? [phone] Web site: [email or web address]  

## 2014-10-11 NOTE — Transfer of Care (Signed)
Immediate Anesthesia Transfer of Care Note  Patient: Dawn Arroyo  Procedure(s) Performed: Procedure(s): LAPAROSCOPIC CHOLECYSTECTOMY (N/A)  Patient Location: PACU  Anesthesia Type:General  Level of Consciousness: awake  Airway & Oxygen Therapy: Patient Spontanous Breathing and Patient connected to nasal cannula oxygen  Post-op Assessment: Report given to RN and Post -op Vital signs reviewed and stable  Post vital signs: stable  Last Vitals:  Filed Vitals:   10/11/14 0854  BP:   Pulse:   Temp: 36.7 C  Resp:     Complications: No apparent anesthesia complications

## 2014-10-11 NOTE — Anesthesia Procedure Notes (Signed)
Procedure Name: Intubation Date/Time: 10/11/2014 7:35 AM Performed by: Renford Dills Pre-anesthesia Checklist: Patient identified, Emergency Drugs available, Suction available and Patient being monitored Patient Re-evaluated:Patient Re-evaluated prior to inductionOxygen Delivery Method: Circle system utilized Preoxygenation: Pre-oxygenation with 100% oxygen Intubation Type: IV induction Ventilation: Mask ventilation without difficulty Laryngoscope Size: Miller and 2 Grade View: Grade I Tube type: Oral Tube size: 7.0 mm Number of attempts: 1 Airway Equipment and Method: Stylet Placement Confirmation: ETT inserted through vocal cords under direct vision,  positive ETCO2,  CO2 detector and breath sounds checked- equal and bilateral Secured at: 21 cm Tube secured with: Tape Dental Injury: Teeth and Oropharynx as per pre-operative assessment

## 2014-10-12 ENCOUNTER — Encounter (HOSPITAL_COMMUNITY): Payer: Self-pay | Admitting: General Surgery

## 2016-03-05 ENCOUNTER — Other Ambulatory Visit: Payer: Self-pay | Admitting: Internal Medicine

## 2016-03-05 DIAGNOSIS — Z1231 Encounter for screening mammogram for malignant neoplasm of breast: Secondary | ICD-10-CM

## 2016-03-18 ENCOUNTER — Ambulatory Visit
Admission: RE | Admit: 2016-03-18 | Discharge: 2016-03-18 | Disposition: A | Discharge status: Home / Self Care | Payer: Federal, State, Local not specified - PPO | Source: Ambulatory Visit | Attending: Internal Medicine | Admitting: Internal Medicine

## 2016-03-18 DIAGNOSIS — Z1231 Encounter for screening mammogram for malignant neoplasm of breast: Secondary | ICD-10-CM

## 2017-09-06 ENCOUNTER — Other Ambulatory Visit: Payer: Self-pay | Admitting: Internal Medicine

## 2017-09-06 ENCOUNTER — Other Ambulatory Visit (HOSPITAL_COMMUNITY)
Admission: RE | Admit: 2017-09-06 | Discharge: 2017-09-06 | Disposition: A | Discharge status: Home / Self Care | Payer: Federal, State, Local not specified - PPO | Source: Ambulatory Visit | Attending: Internal Medicine | Admitting: Internal Medicine

## 2017-09-06 DIAGNOSIS — Z01411 Encounter for gynecological examination (general) (routine) with abnormal findings: Secondary | ICD-10-CM | POA: Insufficient documentation

## 2017-09-09 LAB — CYTOLOGY - PAP
Diagnosis: NEGATIVE
HPV: NOT DETECTED

## 2017-10-07 ENCOUNTER — Other Ambulatory Visit: Payer: Self-pay | Admitting: Internal Medicine

## 2017-10-07 ENCOUNTER — Ambulatory Visit
Admission: RE | Admit: 2017-10-07 | Discharge: 2017-10-07 | Disposition: A | Discharge status: Home / Self Care | Payer: Federal, State, Local not specified - PPO | Source: Ambulatory Visit | Attending: Internal Medicine | Admitting: Internal Medicine

## 2017-10-07 DIAGNOSIS — Z1231 Encounter for screening mammogram for malignant neoplasm of breast: Secondary | ICD-10-CM

## 2019-01-31 ENCOUNTER — Other Ambulatory Visit: Payer: Self-pay

## 2019-01-31 DIAGNOSIS — Z20822 Contact with and (suspected) exposure to covid-19: Secondary | ICD-10-CM

## 2019-02-02 LAB — NOVEL CORONAVIRUS, NAA: SARS-CoV-2, NAA: NOT DETECTED

## 2019-05-01 ENCOUNTER — Other Ambulatory Visit: Payer: Self-pay | Admitting: Internal Medicine

## 2019-05-01 DIAGNOSIS — Z1231 Encounter for screening mammogram for malignant neoplasm of breast: Secondary | ICD-10-CM

## 2019-05-02 ENCOUNTER — Ambulatory Visit
Admission: RE | Admit: 2019-05-02 | Discharge: 2019-05-02 | Disposition: A | Discharge status: Home / Self Care | Payer: Federal, State, Local not specified - PPO | Source: Ambulatory Visit | Attending: Internal Medicine | Admitting: Internal Medicine

## 2019-05-02 ENCOUNTER — Other Ambulatory Visit: Payer: Self-pay

## 2019-05-02 DIAGNOSIS — Z1231 Encounter for screening mammogram for malignant neoplasm of breast: Secondary | ICD-10-CM

## 2020-01-21 ENCOUNTER — Encounter (HOSPITAL_COMMUNITY): Payer: Self-pay

## 2020-01-21 ENCOUNTER — Emergency Department (HOSPITAL_COMMUNITY): Payer: Federal, State, Local not specified - PPO

## 2020-01-21 ENCOUNTER — Emergency Department (HOSPITAL_COMMUNITY)
Admission: EM | Admit: 2020-01-21 | Discharge: 2020-01-21 | Disposition: A | Discharge status: Home / Self Care | Payer: Federal, State, Local not specified - PPO | Attending: Emergency Medicine | Admitting: Emergency Medicine

## 2020-01-21 ENCOUNTER — Other Ambulatory Visit: Payer: Self-pay

## 2020-01-21 DIAGNOSIS — U071 COVID-19: Secondary | ICD-10-CM | POA: Diagnosis not present

## 2020-01-21 DIAGNOSIS — J45909 Unspecified asthma, uncomplicated: Secondary | ICD-10-CM | POA: Insufficient documentation

## 2020-01-21 DIAGNOSIS — R509 Fever, unspecified: Secondary | ICD-10-CM | POA: Diagnosis not present

## 2020-01-21 DIAGNOSIS — R059 Cough, unspecified: Secondary | ICD-10-CM | POA: Diagnosis not present

## 2020-01-21 LAB — RESP PANEL BY RT-PCR (FLU A&B, COVID) ARPGX2
Influenza A by PCR: NEGATIVE
Influenza B by PCR: NEGATIVE
SARS Coronavirus 2 by RT PCR: POSITIVE — AB

## 2020-01-21 NOTE — Discharge Instructions (Signed)
Return to the ER for shortness of breath or severe symptoms

## 2020-01-21 NOTE — ED Provider Notes (Signed)
[facility] EMERGENCY DEPARTMENT Provider Note   CSN: [id number] Arrival date & time: 01/21/20  1329     History No chief complaint on file.   Dawn Arroyo is a 49 y.o. female.  Patient is a 49 year old female with a history of asthma, anemia who is presenting today with 3 days of fever, general malaise, myalgias, headache, cough and some mild chest and back pain.  It has been persistent and slightly worsening over the last 3 days.  Patient has received the Covid vaccine but has not received a booster.  She denies any nausea, vomiting, abdominal pain or diarrhea.  She has no known sick contacts.  Denies any shortness of breath.  The history is provided by the patient.       Past Medical History:  Diagnosis Date  . Anemia    low iron  . Asthma   . PONV (postoperative nausea and vomiting)    headache and then nausea with 2nd c-section    There are no problems to display for this patient.   Past Surgical History:  Procedure Laterality Date  . CESAREAN SECTION    . CHOLECYSTECTOMY N/A 10/11/2014   Procedure: LAPAROSCOPIC CHOLECYSTECTOMY;  Surgeon: Emelia Loron, MD;  Location: [facility] OR;  Service: General;  Laterality: N/A;     OB History   No obstetric history on file.     Family History  Problem Relation Age of Onset  . Breast cancer Neg Hx     Social History   Tobacco Use  . Smoking status: Never Smoker  . Smokeless tobacco: Never Used  Substance Use Topics  . Alcohol use: No  . Drug use: No    Home Medications Prior to Admission medications   Medication Sig Start Date End Date Taking? Authorizing Provider  cetirizine (ZYRTEC) 10 MG tablet Take 10 mg by mouth daily.    [provider]  Multiple Vitamins-Iron (MULTIVITAMIN/IRON PO) Take 1 tablet by mouth every other day.     [provider]  ondansetron (ZOFRAN) 4 MG tablet Take 1 tablet (4 mg total) by mouth every 6 (six) hours. Patient not taking: Reported on  10/09/2014 09/20/14   Raeford Razor, MD  oxyCODONE-acetaminophen (PERCOCET/ROXICET) 5-325 MG per tablet Take 1-2 tablets by mouth every 4 (four) hours as needed for severe pain. Patient not taking: Reported on 10/09/2014 09/20/14   Raeford Razor, MD    Allergies    Patient has no known allergies.  Review of Systems   Review of Systems  All other systems reviewed and are negative.   Physical Exam Updated Vital Signs BP 129/76   Pulse 80   Temp 98.3 F (36.8 C) (Oral)   Resp 13   SpO2 99%   Physical Exam Vitals and nursing note reviewed.  Constitutional:      General: She is not in acute distress.    Appearance: Normal appearance. She is well-developed, normal weight and well-nourished.  HENT:     Head: Normocephalic and atraumatic.  Eyes:     Extraocular Movements: EOM normal.     Pupils: Pupils are equal, round, and reactive to light.  Cardiovascular:     Rate and Rhythm: Normal rate and regular rhythm.     Pulses: Intact distal pulses.     Heart sounds: Normal heart sounds. No murmur heard. No friction rub.  Pulmonary:     Effort: Pulmonary effort is normal.     Breath sounds: Normal breath sounds. No wheezing or rales.  Abdominal:  General: Bowel sounds are normal. There is no distension.     Palpations: Abdomen is soft.     Tenderness: There is no abdominal tenderness. There is no guarding or rebound.  Musculoskeletal:        General: No tenderness. Normal range of motion.     Comments: No edema  Skin:    General: Skin is warm and dry.     Findings: No rash.  Neurological:     Mental Status: She is alert and oriented to person, place, and time. Mental status is at baseline.     Cranial Nerves: No cranial nerve deficit.  Psychiatric:        Mood and Affect: Mood and affect and mood normal.        Behavior: Behavior normal.        Thought Content: Thought content normal.     ED Results / Procedures / Treatments   Labs (all labs ordered are listed, but  only abnormal results are displayed) Labs Reviewed  RESP PANEL BY RT-PCR (FLU A&B, COVID) ARPGX2 - Abnormal; Notable for the following components:      Result Value   SARS Coronavirus 2 by RT PCR POSITIVE (*)    All other components within normal limits    EKG None  Radiology DG Chest 2 View  Result Date: 01/21/2020 CLINICAL DATA:  Cough, chills, and congestion for 3 days EXAM: CHEST - 2 VIEW COMPARISON:  None FINDINGS: Normal heart size, mediastinal contours, and pulmonary vascularity. Lungs clear. No pleural effusion or pneumothorax. Bones unremarkable. IMPRESSION: Normal exam. Electronically Signed   By: Ulyses Southward M.D.   On: 01/21/2020 14:35    Procedures Procedures (including critical care time)  Medications Ordered in ED Medications - No data to display  ED Course  I have reviewed the triage vital signs and the nursing notes.  Pertinent labs & imaging results that were available during my care of the patient were reviewed by me and considered in my medical decision making (see chart for details).    MDM Rules/Calculators/A&P                          Pt with symptoms consistent with covid like symptoms.  Well appearing here.  No signs of breathing difficulty  No signs of pharyngitis, otitis or abnormal abdominal findings.   CXR wnl and pt to return with any further problems.  Pt has been vaccinated and does not meet criteria for monoclonal antibody at this time.  Dawn Arroyo was evaluated in Emergency Department on 01/21/2020 for the symptoms described in the history of present illness. She was evaluated in the context of the global COVID-19 pandemic, which necessitated consideration that the patient might be at risk for infection with the SARS-CoV-2 virus that causes COVID-19. Institutional protocols and algorithms that pertain to the evaluation of patients at risk for COVID-19 are in a state of rapid change based on information released by regulatory bodies including  the CDC and federal and state organizations. These policies and algorithms were followed during the patient's care in the ED.    Final Clinical Impression(s) / ED Diagnoses Final diagnoses:  COVID    Rx / DC Orders ED Discharge Orders    None       Gwyneth Sprout, MD 01/21/20 1946

## 2020-01-21 NOTE — ED Triage Notes (Signed)
Patient complains of cough, chills and congestion x 3 days, has been using neb machine. Fully vaccinated and daughter has pneumonia

## 2020-05-17 DIAGNOSIS — N9089 Other specified noninflammatory disorders of vulva and perineum: Secondary | ICD-10-CM | POA: Diagnosis not present

## 2020-05-17 DIAGNOSIS — Z01419 Encounter for gynecological examination (general) (routine) without abnormal findings: Secondary | ICD-10-CM | POA: Diagnosis not present

## 2020-05-17 DIAGNOSIS — Z113 Encounter for screening for infections with a predominantly sexual mode of transmission: Secondary | ICD-10-CM | POA: Diagnosis not present

## 2020-06-14 ENCOUNTER — Other Ambulatory Visit: Payer: Self-pay | Admitting: Internal Medicine

## 2020-06-14 DIAGNOSIS — N9089 Other specified noninflammatory disorders of vulva and perineum: Secondary | ICD-10-CM | POA: Diagnosis not present

## 2020-06-14 DIAGNOSIS — Z1231 Encounter for screening mammogram for malignant neoplasm of breast: Secondary | ICD-10-CM

## 2020-06-28 DIAGNOSIS — Z23 Encounter for immunization: Secondary | ICD-10-CM | POA: Diagnosis not present

## 2020-06-28 DIAGNOSIS — Z6834 Body mass index (BMI) 34.0-34.9, adult: Secondary | ICD-10-CM | POA: Diagnosis not present

## 2020-06-28 DIAGNOSIS — Z01419 Encounter for gynecological examination (general) (routine) without abnormal findings: Secondary | ICD-10-CM | POA: Diagnosis not present

## 2020-06-28 DIAGNOSIS — Z0000 Encounter for general adult medical examination without abnormal findings: Secondary | ICD-10-CM | POA: Diagnosis not present

## 2020-06-28 DIAGNOSIS — Z13220 Encounter for screening for lipoid disorders: Secondary | ICD-10-CM | POA: Diagnosis not present

## 2020-06-28 DIAGNOSIS — Z113 Encounter for screening for infections with a predominantly sexual mode of transmission: Secondary | ICD-10-CM | POA: Diagnosis not present

## 2020-07-12 DIAGNOSIS — R3989 Other symptoms and signs involving the genitourinary system: Secondary | ICD-10-CM | POA: Diagnosis not present

## 2020-08-15 ENCOUNTER — Other Ambulatory Visit: Payer: Self-pay

## 2020-08-15 ENCOUNTER — Ambulatory Visit
Admission: RE | Admit: 2020-08-15 | Discharge: 2020-08-15 | Disposition: A | Discharge status: Home / Self Care | Payer: Federal, State, Local not specified - PPO | Source: Ambulatory Visit | Attending: Internal Medicine | Admitting: Internal Medicine

## 2020-08-15 DIAGNOSIS — Z1231 Encounter for screening mammogram for malignant neoplasm of breast: Secondary | ICD-10-CM

## 2020-09-01 DIAGNOSIS — Z03818 Encounter for observation for suspected exposure to other biological agents ruled out: Secondary | ICD-10-CM | POA: Diagnosis not present

## 2020-09-01 DIAGNOSIS — R051 Acute cough: Secondary | ICD-10-CM | POA: Diagnosis not present

## 2020-09-01 DIAGNOSIS — R519 Headache, unspecified: Secondary | ICD-10-CM | POA: Diagnosis not present

## 2020-09-01 DIAGNOSIS — R509 Fever, unspecified: Secondary | ICD-10-CM | POA: Diagnosis not present

## 2020-09-01 DIAGNOSIS — R0981 Nasal congestion: Secondary | ICD-10-CM | POA: Diagnosis not present

## 2020-11-26 DIAGNOSIS — M9905 Segmental and somatic dysfunction of pelvic region: Secondary | ICD-10-CM | POA: Diagnosis not present

## 2020-11-26 DIAGNOSIS — M5116 Intervertebral disc disorders with radiculopathy, lumbar region: Secondary | ICD-10-CM | POA: Diagnosis not present

## 2020-11-26 DIAGNOSIS — M25552 Pain in left hip: Secondary | ICD-10-CM | POA: Diagnosis not present

## 2020-11-26 DIAGNOSIS — M9903 Segmental and somatic dysfunction of lumbar region: Secondary | ICD-10-CM | POA: Diagnosis not present

## 2020-12-03 DIAGNOSIS — M9905 Segmental and somatic dysfunction of pelvic region: Secondary | ICD-10-CM | POA: Diagnosis not present

## 2020-12-03 DIAGNOSIS — M25552 Pain in left hip: Secondary | ICD-10-CM | POA: Diagnosis not present

## 2020-12-03 DIAGNOSIS — M9903 Segmental and somatic dysfunction of lumbar region: Secondary | ICD-10-CM | POA: Diagnosis not present

## 2020-12-03 DIAGNOSIS — M5116 Intervertebral disc disorders with radiculopathy, lumbar region: Secondary | ICD-10-CM | POA: Diagnosis not present

## 2020-12-04 DIAGNOSIS — M5116 Intervertebral disc disorders with radiculopathy, lumbar region: Secondary | ICD-10-CM | POA: Diagnosis not present

## 2020-12-04 DIAGNOSIS — M9905 Segmental and somatic dysfunction of pelvic region: Secondary | ICD-10-CM | POA: Diagnosis not present

## 2020-12-04 DIAGNOSIS — M9903 Segmental and somatic dysfunction of lumbar region: Secondary | ICD-10-CM | POA: Diagnosis not present

## 2020-12-04 DIAGNOSIS — M25552 Pain in left hip: Secondary | ICD-10-CM | POA: Diagnosis not present

## 2020-12-09 DIAGNOSIS — M25552 Pain in left hip: Secondary | ICD-10-CM | POA: Diagnosis not present

## 2020-12-09 DIAGNOSIS — M9905 Segmental and somatic dysfunction of pelvic region: Secondary | ICD-10-CM | POA: Diagnosis not present

## 2020-12-09 DIAGNOSIS — M9903 Segmental and somatic dysfunction of lumbar region: Secondary | ICD-10-CM | POA: Diagnosis not present

## 2020-12-09 DIAGNOSIS — M5116 Intervertebral disc disorders with radiculopathy, lumbar region: Secondary | ICD-10-CM | POA: Diagnosis not present

## 2020-12-10 DIAGNOSIS — M5116 Intervertebral disc disorders with radiculopathy, lumbar region: Secondary | ICD-10-CM | POA: Diagnosis not present

## 2020-12-10 DIAGNOSIS — M9903 Segmental and somatic dysfunction of lumbar region: Secondary | ICD-10-CM | POA: Diagnosis not present

## 2020-12-10 DIAGNOSIS — M9905 Segmental and somatic dysfunction of pelvic region: Secondary | ICD-10-CM | POA: Diagnosis not present

## 2020-12-10 DIAGNOSIS — M25552 Pain in left hip: Secondary | ICD-10-CM | POA: Diagnosis not present

## 2020-12-16 DIAGNOSIS — M25552 Pain in left hip: Secondary | ICD-10-CM | POA: Diagnosis not present

## 2020-12-16 DIAGNOSIS — M9903 Segmental and somatic dysfunction of lumbar region: Secondary | ICD-10-CM | POA: Diagnosis not present

## 2020-12-16 DIAGNOSIS — M9905 Segmental and somatic dysfunction of pelvic region: Secondary | ICD-10-CM | POA: Diagnosis not present

## 2020-12-16 DIAGNOSIS — M5116 Intervertebral disc disorders with radiculopathy, lumbar region: Secondary | ICD-10-CM | POA: Diagnosis not present

## 2020-12-17 DIAGNOSIS — M25552 Pain in left hip: Secondary | ICD-10-CM | POA: Diagnosis not present

## 2020-12-17 DIAGNOSIS — M9903 Segmental and somatic dysfunction of lumbar region: Secondary | ICD-10-CM | POA: Diagnosis not present

## 2020-12-17 DIAGNOSIS — M9905 Segmental and somatic dysfunction of pelvic region: Secondary | ICD-10-CM | POA: Diagnosis not present

## 2020-12-17 DIAGNOSIS — M5116 Intervertebral disc disorders with radiculopathy, lumbar region: Secondary | ICD-10-CM | POA: Diagnosis not present

## 2020-12-23 DIAGNOSIS — M9905 Segmental and somatic dysfunction of pelvic region: Secondary | ICD-10-CM | POA: Diagnosis not present

## 2020-12-23 DIAGNOSIS — M9903 Segmental and somatic dysfunction of lumbar region: Secondary | ICD-10-CM | POA: Diagnosis not present

## 2020-12-23 DIAGNOSIS — M5116 Intervertebral disc disorders with radiculopathy, lumbar region: Secondary | ICD-10-CM | POA: Diagnosis not present

## 2020-12-23 DIAGNOSIS — M25552 Pain in left hip: Secondary | ICD-10-CM | POA: Diagnosis not present

## 2020-12-24 DIAGNOSIS — M9905 Segmental and somatic dysfunction of pelvic region: Secondary | ICD-10-CM | POA: Diagnosis not present

## 2020-12-24 DIAGNOSIS — M9903 Segmental and somatic dysfunction of lumbar region: Secondary | ICD-10-CM | POA: Diagnosis not present

## 2020-12-24 DIAGNOSIS — M5116 Intervertebral disc disorders with radiculopathy, lumbar region: Secondary | ICD-10-CM | POA: Diagnosis not present

## 2020-12-24 DIAGNOSIS — M25552 Pain in left hip: Secondary | ICD-10-CM | POA: Diagnosis not present

## 2021-02-03 DIAGNOSIS — M545 Low back pain, unspecified: Secondary | ICD-10-CM | POA: Diagnosis not present

## 2021-02-03 DIAGNOSIS — M25552 Pain in left hip: Secondary | ICD-10-CM | POA: Diagnosis not present

## 2021-02-17 DIAGNOSIS — D125 Benign neoplasm of sigmoid colon: Secondary | ICD-10-CM | POA: Diagnosis not present

## 2021-02-17 DIAGNOSIS — Z8371 Family history of colonic polyps: Secondary | ICD-10-CM | POA: Diagnosis not present

## 2021-02-17 DIAGNOSIS — K649 Unspecified hemorrhoids: Secondary | ICD-10-CM | POA: Diagnosis not present

## 2021-02-17 DIAGNOSIS — Z1211 Encounter for screening for malignant neoplasm of colon: Secondary | ICD-10-CM | POA: Diagnosis not present

## 2021-02-17 DIAGNOSIS — K5730 Diverticulosis of large intestine without perforation or abscess without bleeding: Secondary | ICD-10-CM | POA: Diagnosis not present

## 2021-05-26 DIAGNOSIS — Z01419 Encounter for gynecological examination (general) (routine) without abnormal findings: Secondary | ICD-10-CM | POA: Diagnosis not present

## 2021-07-04 DIAGNOSIS — E039 Hypothyroidism, unspecified: Secondary | ICD-10-CM | POA: Diagnosis not present

## 2021-07-04 DIAGNOSIS — M25562 Pain in left knee: Secondary | ICD-10-CM | POA: Diagnosis not present

## 2021-07-04 DIAGNOSIS — Z13220 Encounter for screening for lipoid disorders: Secondary | ICD-10-CM | POA: Diagnosis not present

## 2021-07-04 DIAGNOSIS — J4521 Mild intermittent asthma with (acute) exacerbation: Secondary | ICD-10-CM | POA: Diagnosis not present

## 2021-07-04 DIAGNOSIS — Z0000 Encounter for general adult medical examination without abnormal findings: Secondary | ICD-10-CM | POA: Diagnosis not present

## 2021-12-31 DIAGNOSIS — R509 Fever, unspecified: Secondary | ICD-10-CM | POA: Diagnosis not present

## 2021-12-31 DIAGNOSIS — R051 Acute cough: Secondary | ICD-10-CM | POA: Diagnosis not present

## 2021-12-31 DIAGNOSIS — R5383 Other fatigue: Secondary | ICD-10-CM | POA: Diagnosis not present

## 2021-12-31 DIAGNOSIS — U071 COVID-19: Secondary | ICD-10-CM | POA: Diagnosis not present

## 2022-06-01 ENCOUNTER — Other Ambulatory Visit (HOSPITAL_COMMUNITY)
Admission: RE | Admit: 2022-06-01 | Discharge: 2022-06-01 | Disposition: A | Discharge status: Home / Self Care | Payer: Federal, State, Local not specified - PPO | Source: Ambulatory Visit | Attending: Nurse Practitioner | Admitting: Nurse Practitioner

## 2022-06-01 ENCOUNTER — Other Ambulatory Visit: Payer: Self-pay | Admitting: Nurse Practitioner

## 2022-06-01 DIAGNOSIS — N898 Other specified noninflammatory disorders of vagina: Secondary | ICD-10-CM | POA: Diagnosis not present

## 2022-06-01 DIAGNOSIS — Z124 Encounter for screening for malignant neoplasm of cervix: Secondary | ICD-10-CM | POA: Insufficient documentation

## 2022-06-01 DIAGNOSIS — Z113 Encounter for screening for infections with a predominantly sexual mode of transmission: Secondary | ICD-10-CM | POA: Diagnosis not present

## 2022-06-01 DIAGNOSIS — N950 Postmenopausal bleeding: Secondary | ICD-10-CM | POA: Diagnosis not present

## 2022-06-01 DIAGNOSIS — Z01419 Encounter for gynecological examination (general) (routine) without abnormal findings: Secondary | ICD-10-CM | POA: Diagnosis not present

## 2022-06-01 DIAGNOSIS — A599 Trichomoniasis, unspecified: Secondary | ICD-10-CM | POA: Diagnosis not present

## 2022-06-01 DIAGNOSIS — A749 Chlamydial infection, unspecified: Secondary | ICD-10-CM | POA: Diagnosis not present

## 2022-06-04 LAB — CYTOLOGY - PAP
Comment: NEGATIVE
Diagnosis: NEGATIVE
High risk HPV: NEGATIVE

## 2022-06-17 ENCOUNTER — Other Ambulatory Visit: Payer: Self-pay

## 2022-06-17 DIAGNOSIS — N950 Postmenopausal bleeding: Secondary | ICD-10-CM | POA: Diagnosis not present

## 2022-07-27 DIAGNOSIS — Z13220 Encounter for screening for lipoid disorders: Secondary | ICD-10-CM | POA: Diagnosis not present

## 2022-07-27 DIAGNOSIS — Z0000 Encounter for general adult medical examination without abnormal findings: Secondary | ICD-10-CM | POA: Diagnosis not present

## 2022-07-27 DIAGNOSIS — E039 Hypothyroidism, unspecified: Secondary | ICD-10-CM | POA: Diagnosis not present

## 2022-07-27 DIAGNOSIS — K1379 Other lesions of oral mucosa: Secondary | ICD-10-CM | POA: Diagnosis not present

## 2022-09-12 DIAGNOSIS — R5383 Other fatigue: Secondary | ICD-10-CM | POA: Diagnosis not present

## 2022-09-12 DIAGNOSIS — U071 COVID-19: Secondary | ICD-10-CM | POA: Diagnosis not present

## 2022-09-12 DIAGNOSIS — H9201 Otalgia, right ear: Secondary | ICD-10-CM | POA: Diagnosis not present

## 2022-09-12 DIAGNOSIS — H6991 Unspecified Eustachian tube disorder, right ear: Secondary | ICD-10-CM | POA: Diagnosis not present

## 2022-09-30 DIAGNOSIS — L02212 Cutaneous abscess of back [any part, except buttock]: Secondary | ICD-10-CM | POA: Diagnosis not present

## 2022-09-30 DIAGNOSIS — L089 Local infection of the skin and subcutaneous tissue, unspecified: Secondary | ICD-10-CM | POA: Diagnosis not present

## 2022-10-31 DIAGNOSIS — M62830 Muscle spasm of back: Secondary | ICD-10-CM | POA: Diagnosis not present

## 2022-10-31 DIAGNOSIS — M545 Low back pain, unspecified: Secondary | ICD-10-CM | POA: Diagnosis not present

## 2022-10-31 DIAGNOSIS — M7711 Lateral epicondylitis, right elbow: Secondary | ICD-10-CM | POA: Diagnosis not present

## 2022-11-17 IMAGING — CR DG CHEST 2V
2 series · 2 of 2 positions shown · non-contrast
Comparison: None

CLINICAL DATA: Cough, chills, and congestion for 3 days

EXAM:
CHEST - 2 VIEW

[chest pa]
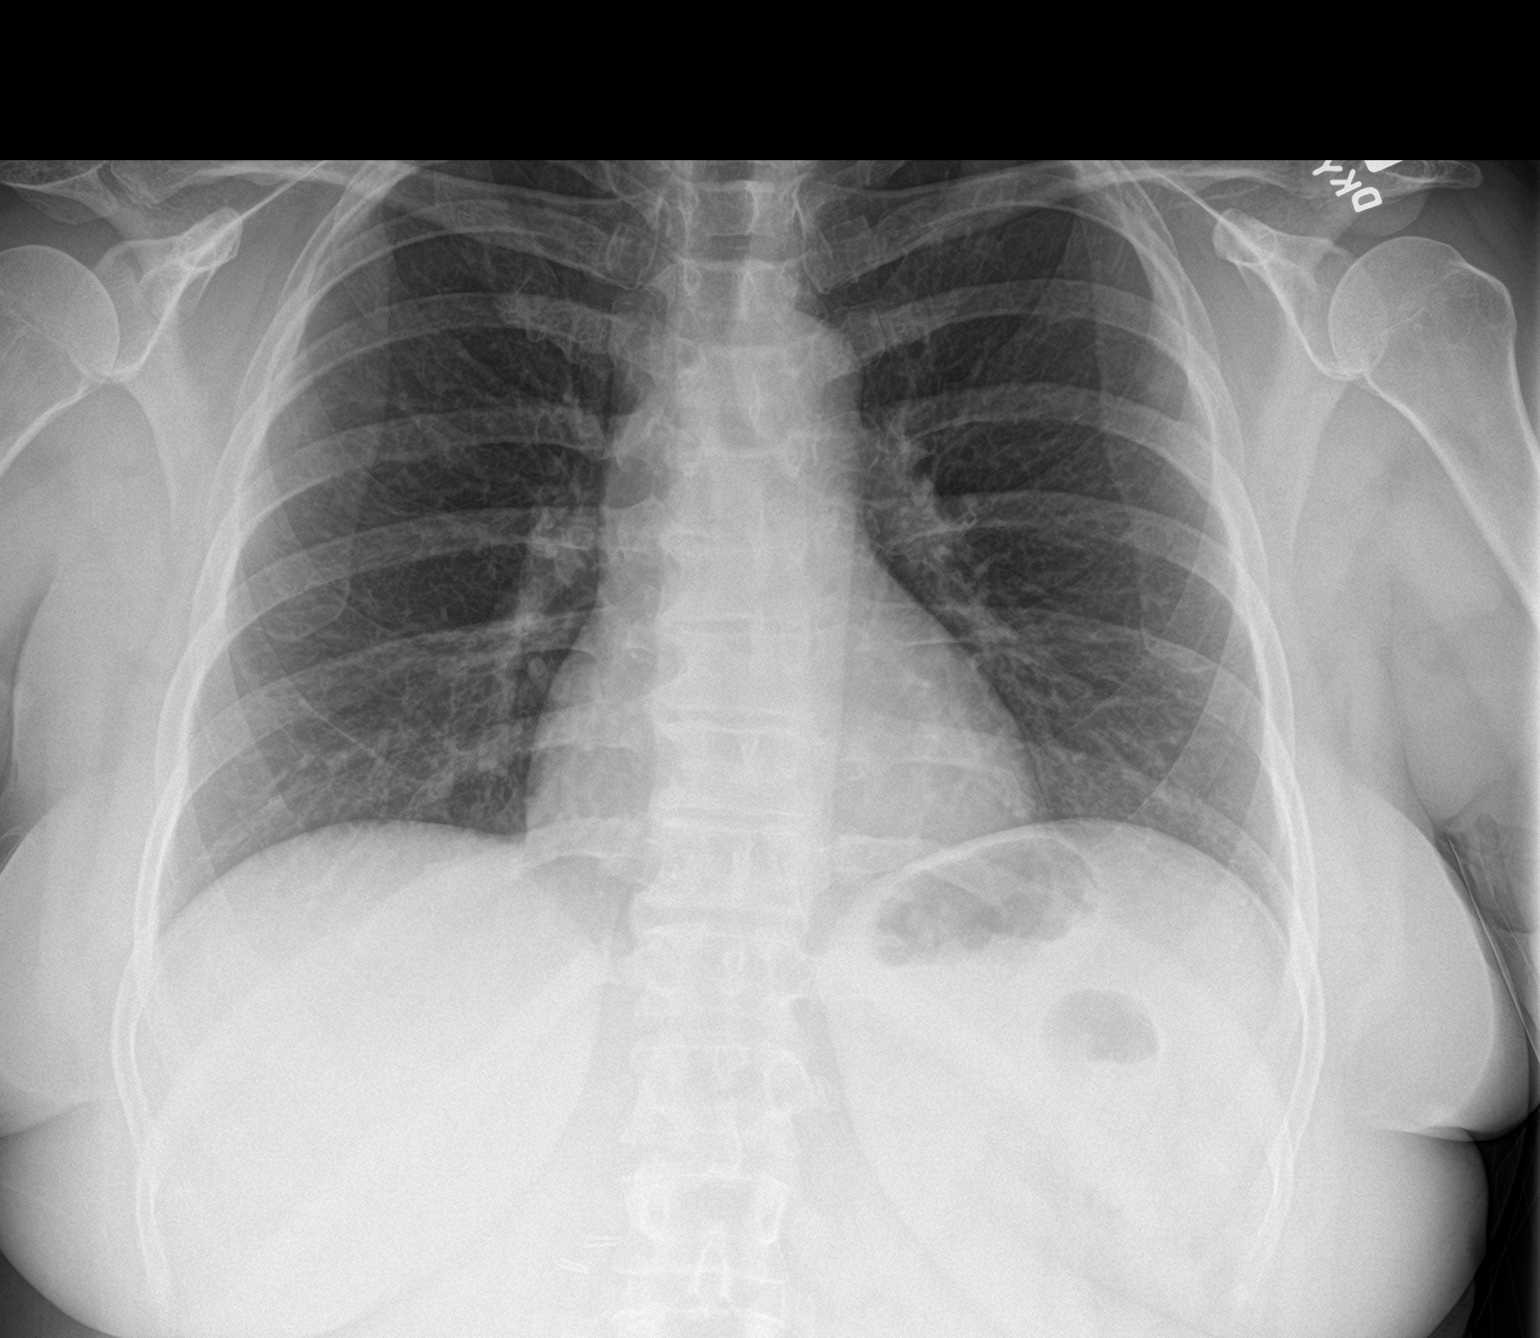

[chest lat]
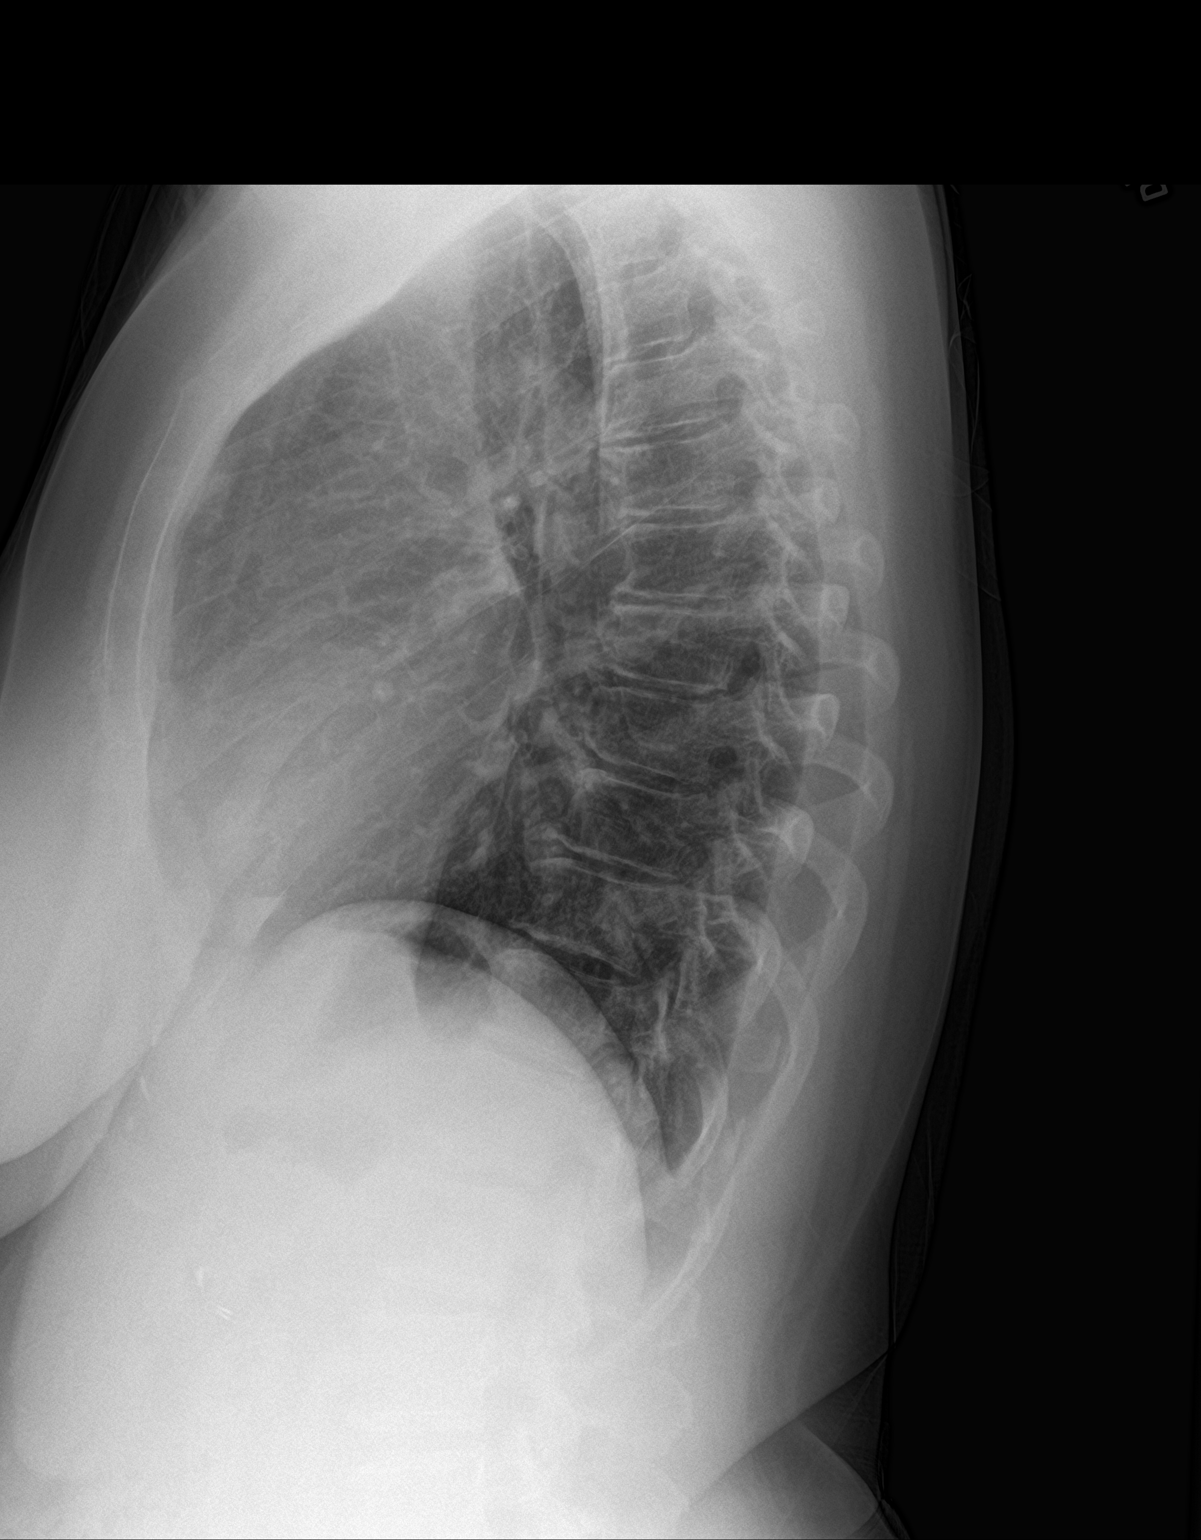

[2 of 2 positions shown; findings below may reference images not displayed]

FINDINGS: Normal heart size, mediastinal contours, and pulmonary vascularity.

Lungs clear.

No pleural effusion or pneumothorax.

Bones unremarkable.
IMPRESSION: Normal exam.

## 2022-12-03 DIAGNOSIS — N898 Other specified noninflammatory disorders of vagina: Secondary | ICD-10-CM | POA: Diagnosis not present

## 2022-12-03 DIAGNOSIS — Z8619 Personal history of other infectious and parasitic diseases: Secondary | ICD-10-CM | POA: Diagnosis not present

## 2022-12-03 DIAGNOSIS — Z7251 High risk heterosexual behavior: Secondary | ICD-10-CM | POA: Diagnosis not present

## 2022-12-03 DIAGNOSIS — N9089 Other specified noninflammatory disorders of vulva and perineum: Secondary | ICD-10-CM | POA: Diagnosis not present

## 2022-12-04 DIAGNOSIS — Z23 Encounter for immunization: Secondary | ICD-10-CM | POA: Diagnosis not present

## 2022-12-04 DIAGNOSIS — H60501 Unspecified acute noninfective otitis externa, right ear: Secondary | ICD-10-CM | POA: Diagnosis not present

## 2022-12-04 DIAGNOSIS — H66003 Acute suppurative otitis media without spontaneous rupture of ear drum, bilateral: Secondary | ICD-10-CM | POA: Diagnosis not present

## 2023-02-02 ENCOUNTER — Emergency Department (HOSPITAL_COMMUNITY)
Admission: EM | Admit: 2023-02-02 | Discharge: 2023-02-03 | Disposition: A | Discharge status: Home / Self Care | Payer: Federal, State, Local not specified - PPO | Attending: Emergency Medicine | Admitting: Emergency Medicine

## 2023-02-02 ENCOUNTER — Encounter (HOSPITAL_COMMUNITY): Payer: Self-pay | Admitting: Emergency Medicine

## 2023-02-02 ENCOUNTER — Other Ambulatory Visit: Payer: Self-pay

## 2023-02-02 DIAGNOSIS — J45909 Unspecified asthma, uncomplicated: Secondary | ICD-10-CM | POA: Diagnosis not present

## 2023-02-02 DIAGNOSIS — R1032 Left lower quadrant pain: Secondary | ICD-10-CM | POA: Diagnosis not present

## 2023-02-02 DIAGNOSIS — K5732 Diverticulitis of large intestine without perforation or abscess without bleeding: Secondary | ICD-10-CM | POA: Diagnosis not present

## 2023-02-02 DIAGNOSIS — R1031 Right lower quadrant pain: Secondary | ICD-10-CM | POA: Diagnosis not present

## 2023-02-02 DIAGNOSIS — K5792 Diverticulitis of intestine, part unspecified, without perforation or abscess without bleeding: Secondary | ICD-10-CM

## 2023-02-02 DIAGNOSIS — R399 Unspecified symptoms and signs involving the genitourinary system: Secondary | ICD-10-CM | POA: Diagnosis not present

## 2023-02-02 DIAGNOSIS — R1030 Lower abdominal pain, unspecified: Secondary | ICD-10-CM | POA: Diagnosis not present

## 2023-02-02 LAB — LIPASE, BLOOD: Lipase: 30 U/L (ref 11–51)

## 2023-02-02 LAB — CBC
HCT: 39.4 % (ref 36.0–46.0)
Hemoglobin: 13 g/dL (ref 12.0–15.0)
MCH: 30 pg (ref 26.0–34.0)
MCHC: 33 g/dL (ref 30.0–36.0)
MCV: 90.8 fL (ref 80.0–100.0)
Platelets: 302 10*3/uL (ref 150–400)
RBC: 4.34 MIL/uL (ref 3.87–5.11)
RDW: 13.2 % (ref 11.5–15.5)
WBC: 8.4 10*3/uL (ref 4.0–10.5)
nRBC: 0 % (ref 0.0–0.2)

## 2023-02-02 LAB — URINALYSIS, ROUTINE W REFLEX MICROSCOPIC
Bilirubin Urine: NEGATIVE
Glucose, UA: NEGATIVE mg/dL
Hgb urine dipstick: NEGATIVE
Ketones, ur: NEGATIVE mg/dL
Leukocytes,Ua: NEGATIVE
Nitrite: NEGATIVE
Protein, ur: NEGATIVE mg/dL
Specific Gravity, Urine: 1.008 (ref 1.005–1.030)
pH: 5 (ref 5.0–8.0)

## 2023-02-02 LAB — COMPREHENSIVE METABOLIC PANEL
ALT: 35 U/L (ref 0–44)
AST: 28 U/L (ref 15–41)
Albumin: 4.2 g/dL (ref 3.5–5.0)
Alkaline Phosphatase: 43 U/L (ref 38–126)
Anion gap: 10 (ref 5–15)
BUN: 14 mg/dL (ref 6–20)
CO2: 24 mmol/L (ref 22–32)
Calcium: 9.6 mg/dL (ref 8.9–10.3)
Chloride: 104 mmol/L (ref 98–111)
Creatinine, Ser: 0.91 mg/dL (ref 0.44–1.00)
GFR, Estimated: >60 mL/min (ref >60)
Glucose, Bld: 98 mg/dL (ref 70–99)
Potassium: 4.1 mmol/L (ref 3.5–5.1)
Sodium: 138 mmol/L (ref 135–145)
Total Bilirubin: 1.2 mg/dL (ref 0.0–1.2)
Total Protein: 7.5 g/dL (ref 6.5–8.1)

## 2023-02-02 NOTE — ED Triage Notes (Signed)
 PT complains of lower abd pain x 3 days. Denies n/v/d. Pt went to walk in clinic today and advised to come here for further evaluation.

## 2023-02-03 ENCOUNTER — Emergency Department (HOSPITAL_COMMUNITY): Payer: Federal, State, Local not specified - PPO

## 2023-02-03 DIAGNOSIS — K5732 Diverticulitis of large intestine without perforation or abscess without bleeding: Secondary | ICD-10-CM | POA: Diagnosis not present

## 2023-02-03 DIAGNOSIS — R1032 Left lower quadrant pain: Secondary | ICD-10-CM | POA: Diagnosis not present

## 2023-02-03 MED ORDER — AMOXICILLIN-POT CLAVULANATE 875-125 MG PO TABS: 1.0000 | ORAL_TABLET | Freq: Two times a day (BID) | ORAL | 0 refills | Status: AC

## 2023-02-03 MED ORDER — IOHEXOL 350 MG/ML SOLN
75.0000 mL | Freq: Once | INTRAVENOUS | Status: AC | PRN
  Administered 2023-02-03: 75 mL via INTRAVENOUS

## 2023-02-03 MED ORDER — AMOXICILLIN-POT CLAVULANATE 875-125 MG PO TABS
1.0000 | ORAL_TABLET | Freq: Once | ORAL | Status: AC
  Administered 2023-02-03: 1 via ORAL
  Filled 2023-02-03: qty 1

## 2023-02-03 MED ORDER — MORPHINE SULFATE (PF) 4 MG/ML IV SOLN
4.0000 mg | Freq: Once | INTRAVENOUS | Status: AC
  Administered 2023-02-03: 4 mg via INTRAVENOUS
  Filled 2023-02-03: qty 1

## 2023-02-03 NOTE — ED Provider Notes (Signed)
 [facility]GENC[facility] DEPARTMENT AT [facility] Provider Note   CSN: [id number] Arrival date & time: 02/02/23  1426     History  Chief Complaint  Patient presents with   Abdominal Pain    Dawn Arroyo is a 53 y.o. female with a past medical history significant for asthma and anemia who presents to the ED due to lower abdominal pain x 3 days.  Denies associated nausea, vomiting, and diarrhea.  Patient was sent from [facility] walk-in clinic.  Denies vaginal symptoms.  No vaginal discharge or dysuria.  No concern for STIs.  Previous cholecystectomy however, no other abdominal operations.  No fever or chills.  History obtained from patient and past medical records. No interpreter used during encounter.       Home Medications Prior to Admission medications   Medication Sig Start Date End Date Taking? Authorizing Provider  amoxicillin -clavulanate (AUGMENTIN ) 875-125 MG tablet Take 1 tablet by mouth every 12 (twelve) hours. 02/03/23  Yes Idy Rawling, Aleck BROCKS, PA-C  cetirizine (ZYRTEC) 10 MG tablet Take 10 mg by mouth daily.    [provider]  cyclobenzaprine (FLEXERIL) 10 MG tablet Take 10 mg by mouth every 8 (eight) hours as needed. 10/31/22   [provider]  HYDROcodone-acetaminophen  (NORCO/VICODIN) 5-325 MG tablet Take 1 tablet by mouth every 8 (eight) hours as needed. 10/31/22   [provider]  metroNIDAZOLE (FLAGYL) 500 MG tablet Take 500 mg by mouth 2 (two) times daily. 12/03/22   [provider]  Multiple Vitamins-Iron (MULTIVITAMIN/IRON PO) Take 1 tablet by mouth every other day.     [provider]  ofloxacin (OCUFLOX) 0.3 % ophthalmic solution 5 drops daily. 12/08/22   [provider]  ondansetron  (ZOFRAN ) 4 MG tablet Take 1 tablet (4 mg total) by mouth every 6 (six) hours. Patient not taking: Reported on 10/09/2014 09/20/14   Loetta Senior, MD  oxyCODONE -acetaminophen  (PERCOCET/ROXICET) 5-325 MG per tablet Take 1-2 tablets  by mouth every 4 (four) hours as needed for severe pain. Patient not taking: Reported on 10/09/2014 09/20/14   Loetta Senior, MD  valACYclovir (VALTREX) 500 MG tablet Take 500 mg by mouth 2 (two) times daily as needed. 12/03/22   [provider]      Allergies    Patient has no known allergies.    Review of Systems   Review of Systems  Constitutional:  Negative for fever.  Respiratory:  Negative for shortness of breath.   Cardiovascular:  Negative for chest pain.  Gastrointestinal:  Positive for abdominal pain. Negative for diarrhea, nausea and vomiting.    Physical Exam Updated Vital Signs BP 137/78 (BP Location: Left Arm)   Pulse 69   Temp 97.8 F (36.6 C) (Oral)   Resp 16   Ht 5' 4 (1.626 m)   Wt 95.3 kg   LMP 03/04/2019   SpO2 100%   BMI 36.05 kg/m  Physical Exam Vitals and nursing note reviewed.  Constitutional:      General: She is not in acute distress.    Appearance: She is not ill-appearing.  HENT:     Head: Normocephalic.  Eyes:     Pupils: Pupils are equal, round, and reactive to light.  Cardiovascular:     Rate and Rhythm: Normal rate and regular rhythm.     Pulses: Normal pulses.     Heart sounds: Normal heart sounds. No murmur heard.    No friction rub. No gallop.  Pulmonary:     Effort: Pulmonary effort is normal.  Breath sounds: Normal breath sounds.  Abdominal:     General: Abdomen is flat. There is no distension.     Palpations: Abdomen is soft.     Tenderness: There is abdominal tenderness. There is no guarding or rebound.     Comments: Diffuse abdominal tenderness, most significant in lower quadrants.   Musculoskeletal:        General: Normal range of motion.     Cervical back: Neck supple.  Skin:    General: Skin is warm and dry.  Neurological:     General: No focal deficit present.     Mental Status: She is alert.  Psychiatric:        Mood and Affect: Mood normal.        Behavior: Behavior normal.     ED Results /  Procedures / Treatments   Labs (all labs ordered are listed, but only abnormal results are displayed) Labs Reviewed  URINALYSIS, ROUTINE W REFLEX MICROSCOPIC - Abnormal; Notable for the following components:      Result Value   Color, Urine STRAW (*)    All other components within normal limits  LIPASE, BLOOD  COMPREHENSIVE METABOLIC PANEL  CBC    EKG None  Radiology CT ABDOMEN PELVIS W CONTRAST Result Date: 02/03/2023 CLINICAL DATA:  Left lower quadrant pain for 3 days EXAM: CT ABDOMEN AND PELVIS WITH CONTRAST TECHNIQUE: Multidetector CT imaging of the abdomen and pelvis was performed using the standard protocol following bolus administration of intravenous contrast. RADIATION DOSE REDUCTION: This exam was performed according to the departmental dose-optimization program which includes automated exposure control, adjustment of the mA and/or kV according to patient size and/or use of iterative reconstruction technique. CONTRAST:  75mL OMNIPAQUE  IOHEXOL  350 MG/ML SOLN COMPARISON:  None Available. FINDINGS: Lower Chest: No acute findings. Hepatobiliary: No suspicious hepatic masses identified. Prior cholecystectomy. No evidence of biliary obstruction. Pancreas:  No mass or inflammatory changes. Spleen: Within normal limits in size and appearance. Adrenals/Urinary Tract: No suspicious masses identified. No evidence of ureteral calculi or hydronephrosis. Stomach/Bowel: No evidence of obstruction, inflammatory process or abnormal fluid collections. Mild diverticulitis is seen involving the proximal sigmoid colon. No evidence of perforation or abscess Vascular/Lymphatic: No pathologically enlarged lymph nodes. No acute vascular findings. Reproductive:  No mass or other significant abnormality. Other:  None. Musculoskeletal:  No suspicious bone lesions identified. IMPRESSION: Mild uncomplicated sigmoid diverticulitis. Electronically Signed   By: Norleen DELENA Kil M.D.   On: 02/03/2023 10:05     Procedures Procedures    Medications Ordered in ED Medications  amoxicillin -clavulanate (AUGMENTIN ) 875-125 MG per tablet 1 tablet (has no administration in time range)  morphine  (PF) 4 MG/ML injection 4 mg (4 mg Intravenous Given 02/03/23 0822)  iohexol  (OMNIPAQUE ) 350 MG/ML injection 75 mL (75 mLs Intravenous Contrast Given 02/03/23 0944)    ED Course/ Medical Decision Making/ A&P                                 Medical Decision Making Amount and/or Complexity of Data Reviewed External Data Reviewed: notes. Labs: ordered. Decision-making details documented in ED Course. Radiology: ordered and independent interpretation performed. Decision-making details documented in ED Course.  Risk Prescription drug management.   This patient presents to the ED for concern of abdominal pain, this involves an extensive number of treatment options, and is a complaint that carries with it a high risk of complications and morbidity.  The differential diagnosis  includes appendicitis, diverticulitis, gastroenteritis, UTI, etc  53 year old female presents to the ED due to lower abdominal pain x 3 days.  Sent by [facility] walk-in clinic for further evaluation.  No nausea, vomiting, or diarrhea.  No vaginal or urinary symptoms.  Unfortunately, patient waited over 16.5 hours prior to my initial evaluation due to long wait times.  During initial evaluation, vitals all within normal limits.  Patient in no acute distress.  Abdomen soft, nondistended with diffuse abdominal tenderness most significant in lower quadrants.  Routine labs ordered in triage.  Added CT abdomen to rule out evidence of appendicitis versus diverticulitis versus other etiologies of abdominal pain.  IV morphine  given.  CBC unremarkable.  No leukocytosis.  Normal hemoglobin.  Lipase normal.  Low suspicion for pancreatitis.  CMP unremarkable.  Normal renal function.  No major electrolyte derangements.  UA negative for signs of infection.  Low  suspicion for pyelonephritis/ acute cystitis.   CT abdomen personally reviewed and interpreted which demonstrates sigmoid diverticulitis which is likely causing patient's lower abdominal pain.  Patient able to tolerate p.o. here in the ED.  Patient given first dose of Augmentin  here.  Advised patient follow-up with her GI doctor for further evaluation.  Patient stable for discharge. Strict ED precautions discussed with patient. Patient states understanding and agrees to plan. Patient discharged home in no acute distress and stable vitals  Co morbidities that complicate the patient evaluation  Asthma, anemia   Social Determinants of Health:  PCP on file  Test / Admission - Considered:  Patient able to tolerate po. Uncomplicated diverticulitis. Does not warrant admission at this time.          Final Clinical Impression(s) / ED Diagnoses Final diagnoses:  Diverticulitis    Rx / DC Orders ED Discharge Orders          Ordered    amoxicillin -clavulanate (AUGMENTIN ) 875-125 MG tablet  Every 12 hours        02/03/23 1029              Tinsley Lomas C, PA-C 02/03/23 1113    Levander Houston, MD 02/03/23 1536

## 2023-02-15 DIAGNOSIS — T148XXA Other injury of unspecified body region, initial encounter: Secondary | ICD-10-CM | POA: Diagnosis not present

## 2023-02-15 DIAGNOSIS — S46819A Strain of other muscles, fascia and tendons at shoulder and upper arm level, unspecified arm, initial encounter: Secondary | ICD-10-CM | POA: Diagnosis not present

## 2023-02-15 DIAGNOSIS — S29011A Strain of muscle and tendon of front wall of thorax, initial encounter: Secondary | ICD-10-CM | POA: Diagnosis not present

## 2023-04-15 DIAGNOSIS — K5732 Diverticulitis of large intestine without perforation or abscess without bleeding: Secondary | ICD-10-CM | POA: Diagnosis not present

## 2023-04-15 DIAGNOSIS — R109 Unspecified abdominal pain: Secondary | ICD-10-CM | POA: Diagnosis not present

## 2023-06-25 DIAGNOSIS — Z01419 Encounter for gynecological examination (general) (routine) without abnormal findings: Secondary | ICD-10-CM | POA: Diagnosis not present

## 2023-08-06 DIAGNOSIS — K5730 Diverticulosis of large intestine without perforation or abscess without bleeding: Secondary | ICD-10-CM | POA: Diagnosis not present

## 2023-08-06 DIAGNOSIS — Z0000 Encounter for general adult medical examination without abnormal findings: Secondary | ICD-10-CM | POA: Diagnosis not present

## 2023-08-06 DIAGNOSIS — Z8639 Personal history of other endocrine, nutritional and metabolic disease: Secondary | ICD-10-CM | POA: Diagnosis not present

## 2023-08-06 DIAGNOSIS — J301 Allergic rhinitis due to pollen: Secondary | ICD-10-CM | POA: Diagnosis not present

## 2023-08-06 DIAGNOSIS — E559 Vitamin D deficiency, unspecified: Secondary | ICD-10-CM | POA: Diagnosis not present

## 2023-08-06 DIAGNOSIS — E039 Hypothyroidism, unspecified: Secondary | ICD-10-CM | POA: Diagnosis not present
